# Patient Record
Sex: Male | Born: 1955 | Race: Black or African American | Hispanic: No | Smoking: Never smoker
Health system: Southern US, Community
[De-identification: ages and names within clinical notes are randomized; demographics above are authoritative.]

## PROBLEM LIST (undated history)

## (undated) ENCOUNTER — Emergency Department (HOSPITAL_BASED_OUTPATIENT_CLINIC_OR_DEPARTMENT_OTHER): Admission: EM | Payer: Managed Care, Other (non HMO) | Source: Home / Self Care

## (undated) DIAGNOSIS — J329 Chronic sinusitis, unspecified: Secondary | ICD-10-CM

## (undated) DIAGNOSIS — Z978 Presence of other specified devices: Secondary | ICD-10-CM

## (undated) DIAGNOSIS — I10 Essential (primary) hypertension: Secondary | ICD-10-CM

## (undated) DIAGNOSIS — N200 Calculus of kidney: Secondary | ICD-10-CM

---

## 1999-09-25 ENCOUNTER — Emergency Department (HOSPITAL_COMMUNITY): Admission: EM | Admit: 1999-09-25 | Discharge: 1999-09-25 | Payer: Self-pay | Admitting: Emergency Medicine

## 2000-06-20 ENCOUNTER — Emergency Department (HOSPITAL_COMMUNITY): Admission: EM | Admit: 2000-06-20 | Discharge: 2000-06-20 | Payer: Self-pay | Admitting: Emergency Medicine

## 2000-09-10 ENCOUNTER — Emergency Department (HOSPITAL_COMMUNITY): Admission: EM | Admit: 2000-09-10 | Discharge: 2000-09-10 | Payer: Self-pay | Admitting: Emergency Medicine

## 2001-05-15 ENCOUNTER — Emergency Department (HOSPITAL_COMMUNITY): Admission: EM | Admit: 2001-05-15 | Discharge: 2001-05-15 | Payer: Self-pay | Admitting: Emergency Medicine

## 2001-08-08 ENCOUNTER — Emergency Department (HOSPITAL_COMMUNITY): Admission: EM | Admit: 2001-08-08 | Discharge: 2001-08-08 | Payer: Self-pay | Admitting: Emergency Medicine

## 2002-01-17 ENCOUNTER — Emergency Department (HOSPITAL_COMMUNITY): Admission: EM | Admit: 2002-01-17 | Discharge: 2002-01-18 | Payer: Self-pay | Admitting: Emergency Medicine

## 2002-02-23 ENCOUNTER — Emergency Department (HOSPITAL_COMMUNITY): Admission: EM | Admit: 2002-02-23 | Discharge: 2002-02-23 | Payer: Self-pay | Admitting: *Deleted

## 2003-04-26 ENCOUNTER — Emergency Department (HOSPITAL_COMMUNITY): Admission: EM | Admit: 2003-04-26 | Discharge: 2003-04-26 | Payer: Self-pay | Admitting: Emergency Medicine

## 2004-05-14 ENCOUNTER — Emergency Department (HOSPITAL_COMMUNITY): Admission: EM | Admit: 2004-05-14 | Discharge: 2004-05-14 | Payer: Self-pay | Admitting: Emergency Medicine

## 2004-05-28 ENCOUNTER — Emergency Department (HOSPITAL_COMMUNITY): Admission: EM | Admit: 2004-05-28 | Discharge: 2004-05-28 | Payer: Self-pay | Admitting: Emergency Medicine

## 2006-07-25 ENCOUNTER — Emergency Department (HOSPITAL_COMMUNITY): Admission: EM | Admit: 2006-07-25 | Discharge: 2006-07-25 | Payer: Self-pay | Admitting: Emergency Medicine

## 2007-03-31 ENCOUNTER — Emergency Department (HOSPITAL_COMMUNITY): Admission: EM | Admit: 2007-03-31 | Discharge: 2007-03-31 | Payer: Self-pay | Admitting: Emergency Medicine

## 2008-07-31 ENCOUNTER — Emergency Department: Payer: Self-pay | Admitting: Emergency Medicine

## 2009-03-03 ENCOUNTER — Encounter: Payer: Self-pay | Admitting: Emergency Medicine

## 2009-03-03 ENCOUNTER — Ambulatory Visit: Payer: Self-pay | Admitting: Diagnostic Radiology

## 2009-03-03 ENCOUNTER — Observation Stay (HOSPITAL_COMMUNITY): Admission: EM | Admit: 2009-03-03 | Discharge: 2009-03-03 | Payer: Self-pay | Admitting: Emergency Medicine

## 2009-06-19 ENCOUNTER — Other Ambulatory Visit: Payer: Self-pay | Admitting: Emergency Medicine

## 2009-06-20 ENCOUNTER — Observation Stay (HOSPITAL_COMMUNITY): Admission: EM | Admit: 2009-06-20 | Discharge: 2009-06-21 | Payer: Self-pay | Admitting: Internal Medicine

## 2009-06-20 ENCOUNTER — Ambulatory Visit: Payer: Self-pay | Admitting: Cardiology

## 2009-11-22 ENCOUNTER — Emergency Department (HOSPITAL_BASED_OUTPATIENT_CLINIC_OR_DEPARTMENT_OTHER): Admission: EM | Admit: 2009-11-22 | Discharge: 2009-11-22 | Payer: Self-pay | Admitting: Emergency Medicine

## 2010-10-26 LAB — CBC
MCHC: 32.2 g/dL (ref 30.0–36.0)
MCV: 90.7 fL (ref 78.0–100.0)
Platelets: 273 10*3/uL (ref 150–400)
RDW: 12.8 % (ref 11.5–15.5)
WBC: 8 10*3/uL (ref 4.0–10.5)

## 2010-10-26 LAB — BASIC METABOLIC PANEL
BUN: 12 mg/dL (ref 6–23)
CO2: 28 mEq/L (ref 19–32)
Chloride: 100 mEq/L (ref 96–112)
GFR calc non Af Amer: >60 mL/min (ref >60)
Glucose, Bld: 112 mg/dL — ABNORMAL HIGH (ref 70–99)
Potassium: 3.9 mEq/L (ref 3.5–5.1)
Sodium: 143 mEq/L (ref 135–145)

## 2010-10-26 LAB — CARDIAC PANEL(CRET KIN+CKTOT+MB+TROPI)
CK, MB: 0.8 ng/mL (ref 0.3–4.0)
CK, MB: 1 ng/mL (ref 0.3–4.0)
Relative Index: 0.6 (ref 0.0–2.5)
Relative Index: 0.7 (ref 0.0–2.5)
Total CK: 122 U/L (ref 7–232)

## 2010-10-26 LAB — LIPID PANEL
Cholesterol: 216 mg/dL — ABNORMAL HIGH (ref 0–200)
HDL: 62 mg/dL (ref >39)
Total CHOL/HDL Ratio: 3.5 RATIO
Triglycerides: 140 mg/dL (ref <150)

## 2010-10-26 LAB — POCT CARDIAC MARKERS
CKMB, poc: 1 ng/mL (ref 1.0–8.0)
Myoglobin, poc: 68.5 ng/mL (ref 12–200)
Troponin i, poc: <0.05 ng/mL (ref 0.00–0.09)

## 2010-10-26 LAB — DIFFERENTIAL
Basophils Absolute: 0.4 10*3/uL — ABNORMAL HIGH (ref 0.0–0.1)
Lymphocytes Relative: 32 % (ref 12–46)
Lymphs Abs: 2.5 10*3/uL (ref 0.7–4.0)

## 2010-10-30 LAB — POCT CARDIAC MARKERS
CKMB, poc: <1 ng/mL — ABNORMAL LOW (ref 1.0–8.0)
Myoglobin, poc: 67.3 ng/mL (ref 12–200)
Troponin i, poc: <0.05 ng/mL (ref 0.00–0.09)

## 2010-10-30 LAB — URINALYSIS, ROUTINE W REFLEX MICROSCOPIC
Bilirubin Urine: NEGATIVE
Glucose, UA: NEGATIVE mg/dL
Hgb urine dipstick: NEGATIVE
Ketones, ur: NEGATIVE mg/dL
Nitrite: NEGATIVE
Specific Gravity, Urine: 1.019 (ref 1.005–1.030)
pH: 5.5 (ref 5.0–8.0)

## 2010-10-30 LAB — LIPID PANEL: HDL: 49 mg/dL (ref >39)

## 2010-10-30 LAB — DIFFERENTIAL
Basophils Absolute: 0.1 10*3/uL (ref 0.0–0.1)
Basophils Relative: 1 % (ref 0–1)
Eosinophils Absolute: 0.2 10*3/uL (ref 0.0–0.7)
Eosinophils Relative: 3 % (ref 0–5)
Lymphocytes Relative: 42 % (ref 12–46)
Monocytes Absolute: 0.6 10*3/uL (ref 0.1–1.0)
Neutro Abs: 3.3 10*3/uL (ref 1.7–7.7)
Neutrophils Relative %: 45 % (ref 43–77)

## 2010-10-30 LAB — BASIC METABOLIC PANEL
BUN: 16 mg/dL (ref 6–23)
Calcium: 9.9 mg/dL (ref 8.4–10.5)
Glucose, Bld: 113 mg/dL — ABNORMAL HIGH (ref 70–99)
Sodium: 138 mEq/L (ref 135–145)

## 2010-10-30 LAB — CBC: RDW: 13.4 % (ref 11.5–15.5)

## 2010-12-06 NOTE — H&P (Signed)
NAMEALLANTE, Caleb Hines NO.:  [id number]   MEDICAL RECORD NO.:  [id number]          PATIENT TYPE:  INP   LOCATION:  5511                         FACILITY:  [facility]   PHYSICIAN:  Massie Maroon, MD        DATE OF BIRTH:  [birth year]   DATE OF ADMISSION:  03/03/2009  DATE OF DISCHARGE:                              HISTORY & PHYSICAL   CHIEF COMPLAINT:  Chest pain.   HISTORY OF PRESENT ILLNESS:  A 55 year old male with a history of  hypertension, complains of left-sided pain which is described as a  little bit of pressure that radiates to the shoulder.  He also noted  some slight heartburn.  The patient denied any shortness of breath.  He  did note a little bit of palpitations after he used his Flonase.  The  chest pain occurred yesterday afternoon about 3 to 4 p.m.  The patient  cannot recall exactly what he was doing when it started.  He cannot  recall anything that made it better or worse.  The patient did receive  nitroglycerin paste in the ED, which did not cause relief of his pain.  The patient denies any fever, chills, cough, wheezing, bright red blood  per rectum, black stool.  The patient was transferred from [facility] ED to [facility] for further evaluation of chest  pain.  The patient notes that he has had a stress test which sounds like  a nuclear stress test in the past by his cardiologist, Dr. Viann Fish, which was per patient negative for ischemia.  The patient's  initial troponin-I in the ED was less than 0.05.  His first set of  cardiac markers was done on August 11 at 0048 hours.  Second set of  cardiac markers was done on August 11 at 0209 and was less than 0.05.  The patient will be admitted for workup of chest pain.  He does note  that his wife passed recently and he has been under stress due to that.   PAST MEDICAL HISTORY:  1. Hypertension.  2. Nephrolithiasis.   PAST SURGICAL HISTORY:  None.   SOCIAL HISTORY:  The  patient never smoked.  He does not drink.  He  denies any drug use.  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  His wife  passed away on 2009/01/27.  He has 1 boy and 1 girl.   FAMILY HISTORY:  Mother is alive at age 75 and had a stent placed at age  65.  She was a nonsmoker.  His father died of lung cancer at age 85 and  was a smoker.  He had 1 brother out of 11 siblings who had a massive MI  at age 30, and he was a heavy smoker as well as a drinker.   ALLERGIES:  ASPIRIN causes eye irritation after 3-4 days.   MEDICATIONS:  Micardis 80/25 mg p.o. daily   REVIEW OF SYSTEMS:  Negative for all 10 organ systems except for  pertinent positives as stated above.   PHYSICAL EXAMINATION:  Temperature 97.8, pulse 92, respiratory rate 18,  blood pressure 181/88, pulse oximetry 100% on room air.  HEENT: Anicteric, EOMI, no nystagmus, pupils 1.5 mm, symmetric, direct,  consensual, near reflexes intact.  Mucous membranes moist.  NECK:  No JVD, no bruit, no thyromegaly, no adenopathy.  HEART:  Regular rate and rhythm, S1, S2.  LUNGS:  Clear to auscultation bilaterally.  ABDOMEN:  Soft, nontender, nondistended.  Positive bowel sounds.  EXTREMITIES:  No cyanosis, clubbing or edema, DP pulses 2+ bilaterally.  SKIN:  No rashes.  LYMPH NODES:  No adenopathy.  NEUROLOGIC EXAM:  Nonfocal, cranial nerves II-XII intact, reflexes 2+,  symmetric, diffuse with downgoing toes bilaterally, motor strength 5/5  in all 4 extremities, pinprick intact.     Chest x-ray March 03, 2009:  No acute process.   Cardiac markers:  Troponin-I less than 0.05.  WBC 7.1, hemoglobin 14.8,  platelet count 302.   Sodium 138, potassium 4.5, chloride 99, bicarb 30, BUN 16, creatinine  1.1, glucose 113.   EKG:  Normal sinus rhythm at 93, normal PR, normal QTc, normal axis, ST  elevation in V1, V2, V3, which appears to be more J-point elevation and  was apparent on prior EKG, and T-wave inversion in V4, V5, V6, which was  also  present on prior EKG.   ASSESSMENT/PLAN:  1. Chest pain:  The patient will be placed on telemetry.  We will      check troponin-I q.8 h. x3 sets.  The patient will be made n.p.o.      for now in case Dr. Donnie Aho would like to run a stress test on him      today.  We will continue with aspirin, Lipitor, carvedilol as well      as nitroglycerin paste.  We will consult Dr. Donnie Aho this morning      for further evaluation of his chest pain.  2. Hypertension, uncontrolled.  The patient will be continued on      Micardis 80/25 mg p.o. daily and we will add carvedilol 3.125 mg      p.o. b.i.d.  3. Deep vein thrombosis prophylaxis.  Lovenox 40 mg subcu daily this.      Massie Maroon, MD  Electronically Signed     JYK/MEDQ  D:  03/03/2009  T:  03/03/2009  Job:  [id number]   cc:   Maryelizabeth Rowan, M.D.  Georga Hacking, M.D.

## 2010-12-06 NOTE — Discharge Summary (Signed)
NAMEKIEV, LABROSSE NO.:  [id number]   MEDICAL RECORD NO.:  [id number]          PATIENT TYPE:  INP   LOCATION:  5511                         FACILITY:  [facility]   PHYSICIAN:  Isidor Holts, M.D.  DATE OF BIRTH:  1955/12/21   DATE OF ADMISSION:  03/03/2009  DATE OF DISCHARGE:  03/03/2009                               DISCHARGE SUMMARY   PMD:  Unassigned.   PRIMARY CARDIOLOGIST:  Dr. Viann Fish.   DISCHARGE DIAGNOSES:  1. Atypical chest pain.  2. Hypertension.  3. History of asymmetric septal hypertrophy.  4. Dyslipidemia.  5. Allergic rhinitis.  6. History of urolithiasis.   DISCHARGE MEDICATIONS:  1. Micardis (80/25) one p.o. daily.  2. Flonase nasal spray 2 sprays each nostril p.r.n. daily.  3. Aspirin 81 mg p.o. daily.  4. Prilosec 20 mg p.o. daily.  5. Cholesterol-lowering pill, in preadmission dosage.   PROCEDURES:  Chest x-ray done March 03, 2009.  This showed no acute  cardiopulmonary process.   CONSULTATIONS:  None.   Telephone discussion held with Dr. Viann Fish, cardiologist, on  March 03, 2009.   ADMISSION HISTORY:  As in H and P notes of March 03, 2009, dictated by  Dr. Massie Maroon.  However, in brief, this is a 55 year old male, with  known history of hypertension, dyslipidemia, asymmetric septal  hypertrophy, status post negative stress October 2008 by Dr. Viann Fish, allergic rhinitis, dyslipidemia, presenting with chest pain  which occurred about 3 to 4 p.m. on March 02, 2009.  Pain was described  as left sided, associated with heartburn.  Patient initially presented  to [facility], where he was evaluated, a 12-lead EKG was done,  and then he was transferred to [facility] for admission.   CLINICAL COURSE:  1. Chest pain.  This had somewhat atypical features, was not this      relieved by nitro paste applied en route to [facility],      however, was relieved by GI cocktail administered  by ED M.D,      according to patient.  A 12-lead EKG showed no acute ischemic      changes.  Cardiac enzymes were cycled, remained un-elevated.  As of      March 03, 2009, a.m., patient was totally asymptomatic, and keen      to be discharged.  I did contact Dr. Viann Fish, his primary      cardiologist, via telephone on that date, and Dr. Donnie Aho has kindly      agreed to set up a stress test on March 04, 2009.  Patient,      however, has been placed on low-dose Aspirin and proton pump      inhibitor.   1. Hypertension.  Patient remained normotensive throughout the course      of his hospitalization.   1. Dyslipidemia.  Patient's lipid profile showed the following      findings:  Total cholesterol 201, triglycerides 73, HDL 49, LDL      139.  Patient states that he is already on a cholesterol-lowering  pill, pre-admission, although he does not remember the name.  I      have urged him to continue to utilize this medication, until      reevaluated by his primary M.D.   1. Nasal allergies.  Patient continues on Flonase nasal spray.  There      were no problems referable to this.   1. History of urolithiasis.  Patient was asymptomatic from this      viewpoint.   DISPOSITION:  Patient was on March 03, 2009, asymptomatic, very keen to  go home.  Acute coronary syndrome was ruled out.  He was therefore  discharged accordingly.   DIET:  Heart healthy.   ACTIVITY:  As tolerated.   FOLLOWUP INSTRUCTIONS:  Patient is to follow up with Dr. Viann Fish  on March 03, 2009, after leaving hospital to set up a stress test,  which per Dr. Donnie Aho has been scheduled for March 04, 2009.  Further  management will depend on stress test findings and this will be deferred  to Dr. Viann Fish.  All of this has been communicated to patient and  he verbalized understanding.      Isidor Holts, M.D.  Electronically Signed     CO/MEDQ  D:  03/03/2009  T:  03/03/2009  Job:   [id number]   cc:   Georga Hacking, M.D.

## 2011-02-14 ENCOUNTER — Encounter: Payer: Self-pay | Admitting: *Deleted

## 2011-02-14 ENCOUNTER — Emergency Department (HOSPITAL_BASED_OUTPATIENT_CLINIC_OR_DEPARTMENT_OTHER)
Admission: EM | Admit: 2011-02-14 | Discharge: 2011-02-15 | Disposition: A | Discharge status: Home / Self Care | Payer: Self-pay | Attending: Emergency Medicine | Admitting: Emergency Medicine

## 2011-02-14 DIAGNOSIS — I10 Essential (primary) hypertension: Secondary | ICD-10-CM | POA: Insufficient documentation

## 2011-02-14 DIAGNOSIS — J329 Chronic sinusitis, unspecified: Secondary | ICD-10-CM | POA: Insufficient documentation

## 2011-02-14 HISTORY — DX: Essential (primary) hypertension: I10

## 2011-02-14 NOTE — ED Notes (Signed)
C/o sinus pressure in head and face, Also c/o ear pain and productive cough with yellow sputum

## 2011-02-15 ENCOUNTER — Emergency Department (INDEPENDENT_AMBULATORY_CARE_PROVIDER_SITE_OTHER): Payer: Self-pay

## 2011-02-15 DIAGNOSIS — R05 Cough: Secondary | ICD-10-CM

## 2011-02-15 DIAGNOSIS — R0602 Shortness of breath: Secondary | ICD-10-CM

## 2011-02-15 DIAGNOSIS — R059 Cough, unspecified: Secondary | ICD-10-CM

## 2011-02-15 IMAGING — CR DG CHEST 2V
2 series · 2 of 2 positions shown · non-contrast
Comparison: [DATE]

CLINICAL DATA: Cough, shortness of breath

CHEST - 2 VIEW

[w chest pa]
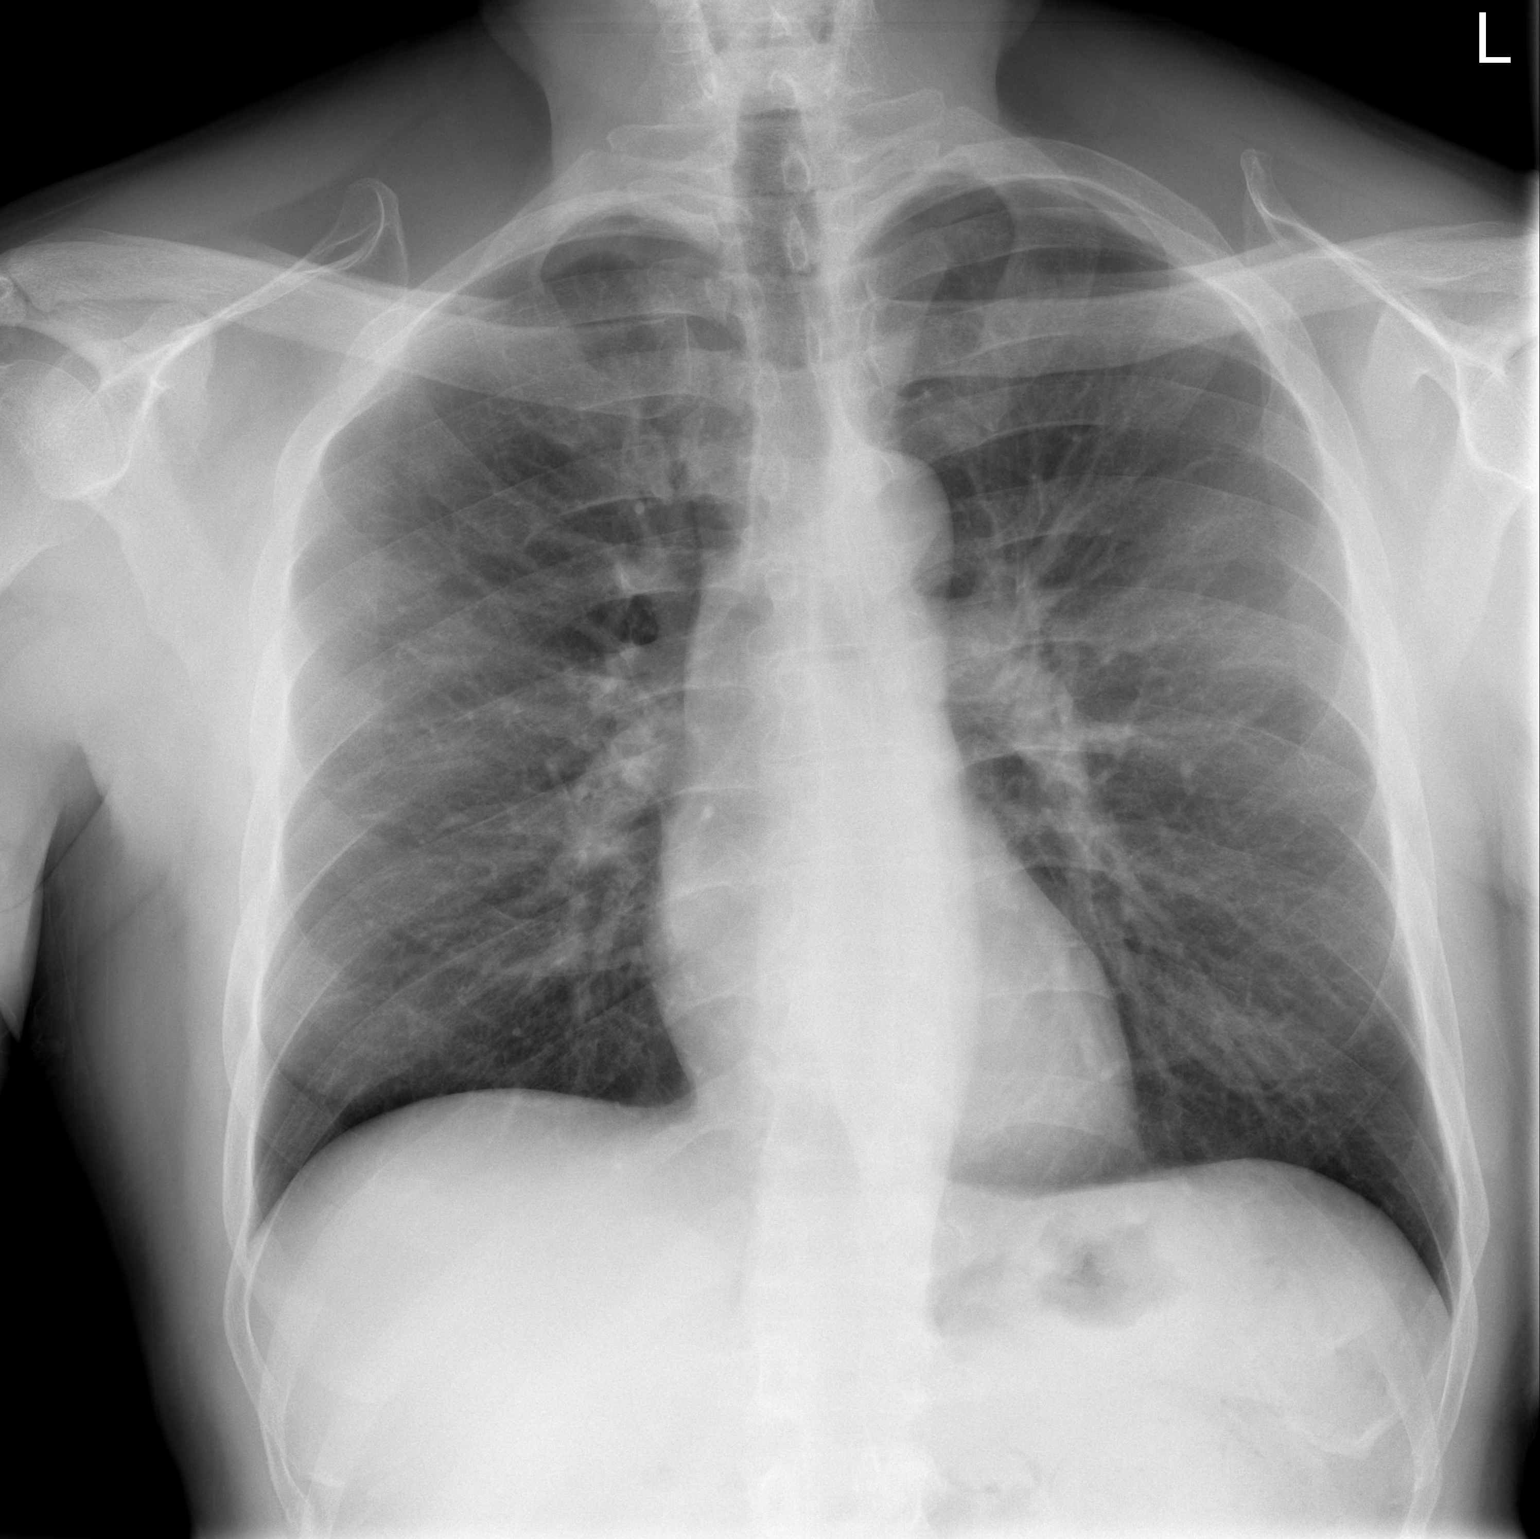

[w chest lat]
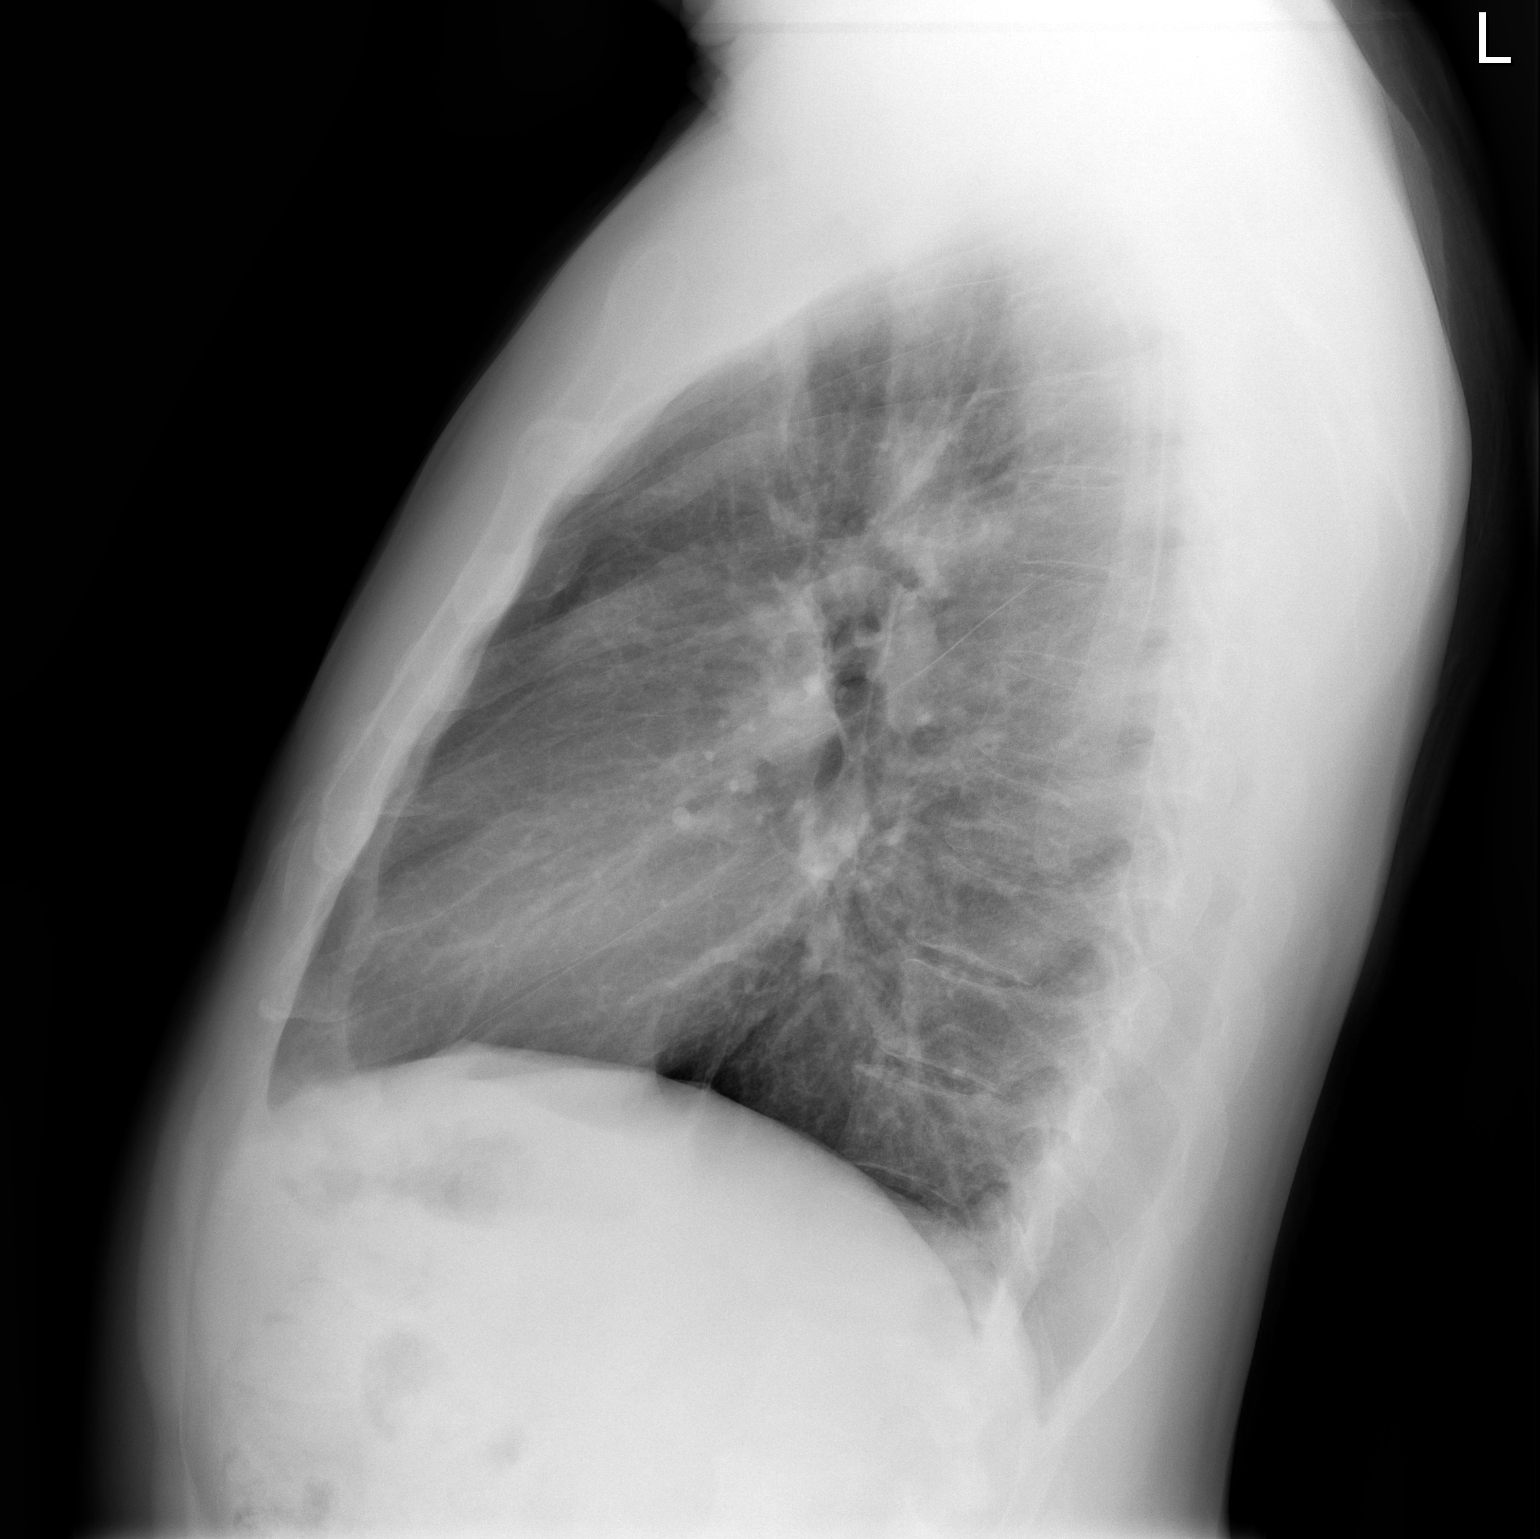

[2 of 2 positions shown; findings below may reference images not displayed]

FINDINGS: Lungs clear.  Heart size and pulmonary vascularity
normal.  No effusion.  Visualized bones unremarkable.
IMPRESSION: No acute disease

## 2011-02-15 MED ORDER — IBUPROFEN 800 MG PO TABS: 800.0000 mg | ORAL_TABLET | Freq: Three times a day (TID) | ORAL | Status: AC

## 2011-02-15 MED ORDER — AMOXICILLIN 500 MG PO CAPS: 500.0000 mg | ORAL_CAPSULE | Freq: Three times a day (TID) | ORAL | Status: AC

## 2011-02-15 NOTE — ED Notes (Signed)
Pt states that he does not need an xray.

## 2011-02-15 NOTE — ED Provider Notes (Addendum)
History     Chief Complaint  Patient presents with  . Sinusitis   Patient is a 55 y.o. male presenting with sinusitis. The history is provided by the patient.  Sinusitis  This is a new problem. The current episode started more than 1 week ago. The problem has not changed since onset.There has been no fever. The pain is moderate. The pain has been constant since onset. Associated symptoms include congestion, hoarse voice, sinus pressure, sore throat and cough. Pertinent negatives include no chills, no sweats, no ear pain, no swollen glands and no shortness of breath. He has tried nothing for the symptoms. The treatment provided no relief.   States he gets this about once a year and requires amoxicillin. No rash, no CP, feels some PND, has tried zyrtec without relief Past Medical History  Diagnosis Date  . Hypertension     History reviewed. No pertinent past surgical history.  History reviewed. No pertinent family history.  History  Substance Use Topics  . Smoking status: Never Smoker   . Smokeless tobacco: Not on file  . Alcohol Use: No      Review of Systems  Constitutional: Negative for fever and chills.  HENT: Positive for congestion, sore throat, hoarse voice and sinus pressure. Negative for ear pain, neck pain and neck stiffness.   Eyes: Negative for pain.  Respiratory: Positive for cough. Negative for shortness of breath.   Cardiovascular: Negative for chest pain.  Gastrointestinal: Negative for abdominal pain.  Genitourinary: Negative for dysuria.  Musculoskeletal: Negative for back pain and gait problem.  Skin: Negative for rash.  Neurological: Negative for headaches.  All other systems reviewed and are negative.    Physical Exam  BP 167/93  Pulse 73  Temp(Src) 98.1 F (36.7 C) (Oral)  Resp 16  SpO2 100%  Physical Exam  Constitutional: He is oriented to person, place, and time. He appears well-developed and well-nourished.  HENT:  Head: Normocephalic and  atraumatic.       Sinuses TTP of bilateral maxilla, nares clear, oral pharynx clear, uvula midline  Eyes: Conjunctivae and EOM are normal. Pupils are equal, round, and reactive to light.  Neck: Trachea normal. Neck supple. No thyromegaly present.  Cardiovascular: Normal rate, regular rhythm, S1 normal, S2 normal and normal pulses.     No systolic murmur is present   No diastolic murmur is present  Pulses:      Radial pulses are 2+ on the right side, and 2+ on the left side.  Pulmonary/Chest: Effort normal and breath sounds normal. He has no wheezes. He has no rhonchi. He has no rales. He exhibits no tenderness.  Abdominal: Soft. Normal appearance and bowel sounds are normal. There is no tenderness. There is no CVA tenderness and negative Murphy's sign.  Musculoskeletal:       BLE:s Calves nontender, no cords or erythema, negative Homans sign  Neurological: He is alert and oriented to person, place, and time. He has normal strength. No cranial nerve deficit or sensory deficit. GCS eye subscore is 4. GCS verbal subscore is 5. GCS motor subscore is 6.  Skin: Skin is warm and dry. No rash noted. He is not diaphoretic.  Psychiatric: His speech is normal.       Cooperative and appropriate    ED Course  Procedures  MDM Treated for sinusitis, states understanding Tx plan and f/u recs. CXR obtained and reviewed.       Sunnie Nielsen, MD 02/15/11 [id number]  Sunnie Nielsen, MD 02/15/11  0201 

## 2011-11-12 ENCOUNTER — Emergency Department (HOSPITAL_BASED_OUTPATIENT_CLINIC_OR_DEPARTMENT_OTHER)
Admission: EM | Admit: 2011-11-12 | Discharge: 2011-11-12 | Disposition: A | Discharge status: Home / Self Care | Payer: Managed Care, Other (non HMO) | Attending: Emergency Medicine | Admitting: Emergency Medicine

## 2011-11-12 ENCOUNTER — Encounter (HOSPITAL_BASED_OUTPATIENT_CLINIC_OR_DEPARTMENT_OTHER): Payer: Self-pay | Admitting: *Deleted

## 2011-11-12 DIAGNOSIS — E871 Hypo-osmolality and hyponatremia: Secondary | ICD-10-CM

## 2011-11-12 DIAGNOSIS — Z87442 Personal history of urinary calculi: Secondary | ICD-10-CM | POA: Insufficient documentation

## 2011-11-12 DIAGNOSIS — R339 Retention of urine, unspecified: Secondary | ICD-10-CM

## 2011-11-12 DIAGNOSIS — E860 Dehydration: Secondary | ICD-10-CM | POA: Insufficient documentation

## 2011-11-12 DIAGNOSIS — Z79899 Other long term (current) drug therapy: Secondary | ICD-10-CM | POA: Insufficient documentation

## 2011-11-12 DIAGNOSIS — I10 Essential (primary) hypertension: Secondary | ICD-10-CM | POA: Insufficient documentation

## 2011-11-12 HISTORY — DX: Calculus of kidney: N20.0

## 2011-11-12 LAB — CBC
MCHC: 35.5 g/dL (ref 30.0–36.0)
MCV: 85.9 fL (ref 78.0–100.0)
RBC: 5.05 MIL/uL (ref 4.22–5.81)

## 2011-11-12 LAB — BASIC METABOLIC PANEL
Calcium: 9.6 mg/dL (ref 8.4–10.5)
GFR calc Af Amer: >90 mL/min (ref >90)
GFR calc non Af Amer: >90 mL/min (ref >90)
Glucose, Bld: 136 mg/dL — ABNORMAL HIGH (ref 70–99)

## 2011-11-12 LAB — URINALYSIS, ROUTINE W REFLEX MICROSCOPIC
Leukocytes, UA: NEGATIVE
Nitrite: NEGATIVE
Specific Gravity, Urine: 1.013 (ref 1.005–1.030)
pH: 6 (ref 5.0–8.0)

## 2011-11-12 LAB — URINE MICROSCOPIC-ADD ON

## 2011-11-12 NOTE — ED Provider Notes (Signed)
History   This chart was scribed for Ethelda Chick, MD scribed by Magnus Sinning. The patient was seen in room MH08/MH08 seen at 19:09   CSN: [id number]  Arrival date & time 11/12/11  1719   First MD Initiated Contact with Patient 11/12/11 1838      Chief Complaint  Patient presents with  . Abdominal Pain    (Consider location/radiation/quality/duration/timing/severity/associated sxs/prior treatment) HPI Caleb Hines is a 56 y.o. male who presents to the Emergency Department complaining of constant moderate inability to urinate, onset yesterday, with associated lower abd pain. Says yesterday he took two Aleve-D, for allergies, and noticed he had decreased urinary output ,difficultly urinating, dry mouth, and pain in lower abd. He says he was seen at [facility] yesterday and was informed that his prostate was enlarged and that he had a possible infection. Says he was prescribed an abx for the suspected infection. Says he is able to urinate, but that it is still in small amounts. Denies any other sxs, aggravating, or modifying factors.  Past Medical History  Diagnosis Date  . Hypertension   . Kidney stones     History reviewed. No pertinent past surgical history.  History reviewed. No pertinent family history.  History  Substance Use Topics  . Smoking status: Never Smoker   . Smokeless tobacco: Never Used  . Alcohol Use: No    Review of Systems 10 Systems reviewed and are negative for acute change except as noted in the HPI. Allergies  Review of patient's allergies indicates no known allergies.  Home Medications   Current Outpatient Rx  Name Route Sig Dispense Refill  . VITAMIN C PO Oral Take 2 tablets by mouth daily.    . ATENOLOL 50 MG PO TABS Oral Take 50 mg by mouth daily.      Marland Kitchen CIPROFLOXACIN HCL 250 MG PO TABS Oral Take 250 mg by mouth 2 (two) times daily. For 14 days starting 11/12/11    . HYDROCODONE-ACETAMINOPHEN 5-500 MG PO TABS Oral Take 1-2  tablets by mouth every 6 (six) hours as needed. For pain (max 8 per day)    . IBUPROFEN 800 MG PO TABS Oral Take 800 mg by mouth once as needed. For pain    . MAGNESIUM OXIDE PO Oral Take 1 tablet by mouth daily.    . ADULT MULTIVITAMIN W/MINERALS CH Oral Take 1 tablet by mouth daily.    Marland Kitchen FISH OIL PO Oral Take 1 capsule by mouth daily.    Marland Kitchen PSEUDOEPHEDRINE-NAPROXEN NA ER 120-220 MG PO TB12 Oral Take 2 tablets by mouth once.      BP 171/96  Pulse 64  Temp(Src) 98.1 F (36.7 C) (Oral)  Resp 18  SpO2 100%  Physical Exam  Nursing note and vitals reviewed. Constitutional: He is oriented to person, place, and time. He appears well-developed and well-nourished. No distress.  HENT:  Head: Normocephalic and atraumatic.  Eyes: EOM are normal. Pupils are equal, round, and reactive to light.  Neck: Neck supple. No tracheal deviation present.  Cardiovascular: Normal rate.   Pulmonary/Chest: Effort normal. No respiratory distress.  Abdominal: Soft. He exhibits no distension. There is tenderness.       Mild tenderness to the suprapubic region  Musculoskeletal: Normal range of motion. He exhibits no edema.       No CVA tenderness  Neurological: He is alert and oriented to person, place, and time. No sensory deficit.  Skin: Skin is warm and dry.  Psychiatric: He has  a normal mood and affect. His behavior is normal.    ED Course  Procedures (including critical care time) DIAGNOSTIC STUDIES: Oxygen Saturation is 100% on room air, normal by my interpretation.    COORDINATION OF CARE:  Labs Reviewed  BASIC METABOLIC PANEL - Abnormal; Notable for the following:    Sodium 126 (*)    Chloride 89 (*)    Glucose, Bld 136 (*)    All other components within normal limits  URINALYSIS, ROUTINE W REFLEX MICROSCOPIC - Abnormal; Notable for the following:    Hgb urine dipstick SMALL (*)    All other components within normal limits  CBC  URINE CULTURE  URINE MICROSCOPIC-ADD ON    1. Urinary  retention   2. Hyponatremia       MDM  Patient presents with lower abdominal pain and a sensation of needing to urinate and only being able to urinate small amounts. He was seen earlier today at [facility] emergency department. He states he had a CT scan and was given pain medication and an antibiotic for his urine. He comes to the ED here because he continues to have difficulty urinating and lower abdominal pain. A Foley catheter was placed for urinary retention and patient had approximately 750 cc of urine mouth. His abdominal pain is completely resolved and he feels much improved after the catheter was placed. His renal function is normal. He does have mild hyponatremia. Patient was advised to followup with urology and was given strict return precautions. He is agreeable with this plan.  I personally performed the services described in this documentation, which was scribed in my presence. The recorded information has been reviewed and considered.         Ethelda Chick, MD 11/16/11 [id number]

## 2011-11-12 NOTE — Discharge Instructions (Signed)
Return to the ED with any concerns including decreased urine and to Foley catheter, abdominal pain, fevers or chills, vomiting, decreased level of alertness or lethargy, or any other alarming symptoms.  It is important that you arrange for followup with urology within the next 2-3 days and they will determine when it is advisable to remove the catheter.

## 2011-11-12 NOTE — ED Notes (Signed)
Pt reports low abd pain since wed- today reports he felt like he couldn't get his bladder emptied- went to [facility] ED today and had UA and CT scan- states he is not feeling any better

## 2011-11-12 NOTE — ED Notes (Signed)
Pt verbalizes understanding of use of leg bag-

## 2011-11-14 LAB — URINE CULTURE

## 2011-11-15 ENCOUNTER — Encounter (HOSPITAL_BASED_OUTPATIENT_CLINIC_OR_DEPARTMENT_OTHER): Payer: Self-pay | Admitting: Emergency Medicine

## 2011-11-15 ENCOUNTER — Emergency Department (HOSPITAL_BASED_OUTPATIENT_CLINIC_OR_DEPARTMENT_OTHER)
Admission: EM | Admit: 2011-11-15 | Discharge: 2011-11-15 | Disposition: A | Discharge status: Home / Self Care | Payer: Managed Care, Other (non HMO) | Attending: Emergency Medicine | Admitting: Emergency Medicine

## 2011-11-15 DIAGNOSIS — Z79899 Other long term (current) drug therapy: Secondary | ICD-10-CM | POA: Insufficient documentation

## 2011-11-15 DIAGNOSIS — N400 Enlarged prostate without lower urinary tract symptoms: Secondary | ICD-10-CM

## 2011-11-15 DIAGNOSIS — I10 Essential (primary) hypertension: Secondary | ICD-10-CM | POA: Insufficient documentation

## 2011-11-15 NOTE — ED Notes (Signed)
Pt able to void

## 2011-11-15 NOTE — Discharge Instructions (Signed)
Benign Prostatic Hyperplasia You have an enlarged prostate. This is common in elderly males. It is called BPH. This stands for benign prostate hyperplasia. The prostate gland is located in base of the bladder. When it grows, the prostate blocks the urethra. This is the tube which drains urine from the bladder.  SYMPTOMS  Weak urine stream.   Dribbling.   Feeling like the bladder has not emptied completely.   Difficulty starting urination.   Getting up frequently at night to urinate.   Urinating more frequently during the day.  Complete urinary blockage or severe pain with urination requires immediate attention. DIAGNOSIS   Your caregiver often has a good idea what is wrong by taking a history and doing a physical exam.   Special x-rays may be done.  TREATMENT   For mild problems, no treatment may be necessary.   If the problems are moderate, medications may provide relief. Some of these work by making the prostate gland smaller. The herb saw palmetto is commonly used.   If complete blockage occurs, a Foley catheter is usually left in place for a few days.   Surgery is often needed for more severe problems. TURP is the prostate surgery for BPH which is done through the urethra. TURP stands for transurethral resection of the prostate. It involves cutting away chips from the prostate. It is done by removing chips so that they can come out through the penis.   Techniques using heat, microwave and laser to remove the prostate blockage are also being used.  HOME CARE INSTRUCTIONS   Give yourself time when you urinate.   Stay away from alcohol.   Beverages containing caffeine such as coffee, tea and colas can make the problems worse.   Decongestants, antihistamines, and some prescription medicines can also make the problem worse.   Follow up with your caregiver for further treatment as recommended.  SEEK IMMEDIATE MEDICAL CARE IF:   You develop increased pain with urination or  are unable to pass your water.   You develop severe abdominal pain, vomiting, a high fever, or fainting.   You develop back pain or blood in your urine.  MAKE SURE YOU:   Understand these instructions.   Will watch your condition.   Will get help right away if you are not doing well or get worse.  Document Released: 07/10/2005 Document Revised: 06/29/2011 Document Reviewed: 03/15/2007 [employer] Patient Information 2012 [employer]. 

## 2011-11-15 NOTE — ED Provider Notes (Signed)
History     CSN: [id number]  Arrival date & time 11/15/11  1516   First MD Initiated Contact with Patient 11/15/11 1600      Chief Complaint  Patient presents with  . Follow-up    Needs foley catheter removed.    (Consider location/radiation/quality/duration/timing/severity/associated sxs/prior treatment) HPI Comments: Pt is a 56 year old man who had been seen for urinary retention 3 days ago.  She was referred to [facility], but could not get in in a timely way, and so came to [facility] ED for catheter removal.   Patient is a 56 y.o. male presenting with male genitourinary complaint. The history is provided by the patient. No language interpreter was used.  Male GU Problem Primary symptoms comment: Here for foley catheter removal.  This is a new problem. Episode onset: Today.  He was treated for urinary retention 3 days ago.   The problem has not changed since onset.Context: He has no complaints, has done well with the catheter. There has been no fever. He has tried nothing for the symptoms. Associated medical issues comments: Benign prostatic hypertrophy..    Past Medical History  Diagnosis Date  . Hypertension   . Kidney stones     History reviewed. No pertinent past surgical history.  No family history on file.  History  Substance Use Topics  . Smoking status: Never Smoker   . Smokeless tobacco: Never Used  . Alcohol Use: No      Review of Systems  All other systems reviewed and are negative.    Allergies  Review of patient's allergies indicates no known allergies.  Home Medications   Current Outpatient Rx  Name Route Sig Dispense Refill  . ATENOLOL 50 MG PO TABS Oral Take 50 mg by mouth daily.      Marland Kitchen CIPROFLOXACIN HCL 250 MG PO TABS Oral Take 250 mg by mouth 2 (two) times daily. For 14 days starting 11/12/11    . HYDROCODONE-ACETAMINOPHEN 5-500 MG PO TABS Oral Take 1-2 tablets by mouth every 6 (six) hours as needed. For pain (max 8 per day)     . IBUPROFEN 800 MG PO TABS Oral Take 800 mg by mouth once as needed. For pain    . ADULT MULTIVITAMIN W/MINERALS CH Oral Take 1 tablet by mouth daily.    Marland Kitchen VITAMIN C PO Oral Take 2 tablets by mouth daily.    Marland Kitchen MAGNESIUM OXIDE PO Oral Take 1 tablet by mouth daily.    Marland Kitchen FISH OIL PO Oral Take 1 capsule by mouth daily.    Marland Kitchen PSEUDOEPHEDRINE-NAPROXEN NA ER 120-220 MG PO TB12 Oral Take 2 tablets by mouth once.      BP 165/83  Pulse 67  Temp(Src) 98.1 F (36.7 C) (Oral)  Resp 16  Ht 6\' 2"  (1.88 m)  Wt 215 lb (97.523 kg)  BMI 27.60 kg/m2  SpO2 100%  Physical Exam  Nursing note and vitals reviewed. Constitutional: He is oriented to person, place, and time. He appears well-developed and well-nourished. No distress.  HENT:  Head: Normocephalic and atraumatic.  Right Ear: External ear normal.  Left Ear: External ear normal.  Mouth/Throat: Oropharynx is clear and moist.  Eyes: Conjunctivae and EOM are normal. Pupils are equal, round, and reactive to light.  Neck: Normal range of motion. Neck supple.  Cardiovascular: Normal rate, regular rhythm and normal heart sounds.   Pulmonary/Chest: Effort normal and breath sounds normal.  Abdominal: Soft. Bowel sounds are normal.  Musculoskeletal: Normal  range of motion.  Neurological: He is alert and oriented to person, place, and time.       No sensory or motor deficit.  Skin: Skin is warm and dry.  Psychiatric: He has a normal mood and affect. His behavior is normal.    ED Course  Procedures (including critical care time)   Pt had foley catheter removed.  Given fluids, is waiting to void.    5:26 PM Pt urinated.  Released.  Advised followup at [facility] for BPH.  1. Benign prostatic hypertrophy           Carleene Cooper III, MD 11/15/11 [phone]

## 2011-11-15 NOTE — ED Notes (Signed)
Patient came in on Sunday and had an indwelling urinary catheter placed.  Cannot see urologist soon enough so he was told to come here and have it removed.

## 2012-08-11 ENCOUNTER — Encounter (HOSPITAL_BASED_OUTPATIENT_CLINIC_OR_DEPARTMENT_OTHER): Payer: Self-pay | Admitting: Emergency Medicine

## 2012-08-11 ENCOUNTER — Emergency Department (HOSPITAL_BASED_OUTPATIENT_CLINIC_OR_DEPARTMENT_OTHER)
Admission: EM | Admit: 2012-08-11 | Discharge: 2012-08-11 | Disposition: A | Discharge status: Home / Self Care | Payer: Self-pay | Attending: Emergency Medicine | Admitting: Emergency Medicine

## 2012-08-11 DIAGNOSIS — J3489 Other specified disorders of nose and nasal sinuses: Secondary | ICD-10-CM | POA: Insufficient documentation

## 2012-08-11 DIAGNOSIS — J329 Chronic sinusitis, unspecified: Secondary | ICD-10-CM | POA: Insufficient documentation

## 2012-08-11 DIAGNOSIS — Z79899 Other long term (current) drug therapy: Secondary | ICD-10-CM | POA: Insufficient documentation

## 2012-08-11 DIAGNOSIS — Z791 Long term (current) use of non-steroidal anti-inflammatories (NSAID): Secondary | ICD-10-CM | POA: Insufficient documentation

## 2012-08-11 DIAGNOSIS — I10 Essential (primary) hypertension: Secondary | ICD-10-CM | POA: Insufficient documentation

## 2012-08-11 DIAGNOSIS — Z87442 Personal history of urinary calculi: Secondary | ICD-10-CM | POA: Insufficient documentation

## 2012-08-11 HISTORY — DX: Chronic sinusitis, unspecified: J32.9

## 2012-08-11 MED ORDER — OXYMETAZOLINE HCL 0.05 % NA SOLN: 2.0000 | Freq: Two times a day (BID) | NASAL | Status: DC

## 2012-08-11 MED ORDER — AMOXICILLIN 500 MG PO CAPS: 500.0000 mg | ORAL_CAPSULE | Freq: Three times a day (TID) | ORAL | Status: DC

## 2012-08-11 NOTE — ED Notes (Signed)
Pt having sinus pressure, nasal congestion, yellow drainage, and fever for one month.

## 2012-08-11 NOTE — ED Provider Notes (Signed)
History     CSN: [id number]  Arrival date & time 08/11/12  1214   First MD Initiated Contact with Patient 08/11/12 1232      Chief Complaint  Patient presents with  . Facial Pain  . Nasal Congestion    (Consider location/radiation/quality/duration/timing/severity/associated sxs/prior treatment) HPI Pt with history of recurrent sinusitis responsive to amox presents with R maxillary sinus pressure x 1 month. No fever or chills. +nasal congestion and yellow purulent nasal drainage. No sore throat, neck pain.  Past Medical History  Diagnosis Date  . Hypertension   . Kidney stones   . Sinusitis     No past surgical history on file.  No family history on file.  History  Substance Use Topics  . Smoking status: Never Smoker   . Smokeless tobacco: Never Used  . Alcohol Use: No      Review of Systems  Constitutional: Negative for fever and chills.  HENT: Positive for congestion and sinus pressure. Negative for ear pain, sore throat, neck pain and neck stiffness.   Respiratory: Negative for cough and shortness of breath.   Skin: Negative for rash.  Neurological: Negative for headaches.    Allergies  Review of patient's allergies indicates no known allergies.  Home Medications   Current Outpatient Rx  Name  Route  Sig  Dispense  Refill  . AMOXICILLIN 500 MG PO CAPS   Oral   Take 1 capsule (500 mg total) by mouth 3 (three) times daily.   21 capsule   0   . VITAMIN C PO   Oral   Take 2 tablets by mouth daily.         . ATENOLOL 50 MG PO TABS   Oral   Take 50 mg by mouth daily.           . IBUPROFEN 800 MG PO TABS   Oral   Take 800 mg by mouth once as needed. For pain         . MAGNESIUM OXIDE PO   Oral   Take 1 tablet by mouth daily.         . ADULT MULTIVITAMIN W/MINERALS CH   Oral   Take 1 tablet by mouth daily.         Marland Kitchen FISH OIL PO   Oral   Take 1 capsule by mouth daily.         Marland Kitchen OXYMETAZOLINE HCL 0.05 % NA SOLN   Nasal   Place 2  sprays into the nose 2 (two) times daily.   30 mL   0     BP 172/94  Pulse 65  Temp 98.2 F (36.8 C) (Oral)  Resp 16  SpO2 99%  Physical Exam  Nursing note and vitals reviewed. Constitutional: He is oriented to person, place, and time. He appears well-developed and well-nourished. No distress.  HENT:  Head: Normocephalic and atraumatic.  Nose: Mucosal edema and septal deviation present. Right sinus exhibits maxillary sinus tenderness. Right sinus exhibits no frontal sinus tenderness. Left sinus exhibits no maxillary sinus tenderness and no frontal sinus tenderness.  Mouth/Throat: Oropharynx is clear and moist. No oropharyngeal exudate.  Eyes: Pupils are equal, round, and reactive to light.  Neck: Normal range of motion. Neck supple.  Pulmonary/Chest: Effort normal.  Abdominal: Soft.  Musculoskeletal: Normal range of motion.  Neurological: He is alert and oriented to person, place, and time.  Skin: Skin is warm and dry. He is not diaphoretic.  Psychiatric: He has a normal  mood and affect. His behavior is normal.    ED Course  Procedures (including critical care time)  Labs Reviewed - No data to display No results found.   1. Sinusitis       MDM  Will treat fro R max sinusitis         Loren Racer, MD 08/11/12 1323

## 2012-09-19 ENCOUNTER — Emergency Department (HOSPITAL_BASED_OUTPATIENT_CLINIC_OR_DEPARTMENT_OTHER)
Admission: EM | Admit: 2012-09-19 | Discharge: 2012-09-19 | Disposition: A | Discharge status: Home / Self Care | Payer: Self-pay | Attending: Emergency Medicine | Admitting: Emergency Medicine

## 2012-09-19 ENCOUNTER — Encounter (HOSPITAL_BASED_OUTPATIENT_CLINIC_OR_DEPARTMENT_OTHER): Payer: Self-pay | Admitting: *Deleted

## 2012-09-19 DIAGNOSIS — J3489 Other specified disorders of nose and nasal sinuses: Secondary | ICD-10-CM | POA: Insufficient documentation

## 2012-09-19 DIAGNOSIS — I10 Essential (primary) hypertension: Secondary | ICD-10-CM | POA: Insufficient documentation

## 2012-09-19 DIAGNOSIS — Z87442 Personal history of urinary calculi: Secondary | ICD-10-CM | POA: Insufficient documentation

## 2012-09-19 DIAGNOSIS — Z79899 Other long term (current) drug therapy: Secondary | ICD-10-CM | POA: Insufficient documentation

## 2012-09-19 DIAGNOSIS — J329 Chronic sinusitis, unspecified: Secondary | ICD-10-CM | POA: Insufficient documentation

## 2012-09-19 MED ORDER — SULFAMETHOXAZOLE-TRIMETHOPRIM 800-160 MG PO TABS: 1.0000 | ORAL_TABLET | Freq: Two times a day (BID) | ORAL | Status: DC

## 2012-09-19 NOTE — ED Notes (Signed)
Ear pain x 3 weeks. States he thinks he has sinusitis. Facial pain. No relief with Amoxicillin.

## 2012-09-19 NOTE — ED Provider Notes (Signed)
History     CSN: [id number]  Arrival date & time 09/19/12  1736   First MD Initiated Contact with Patient 09/19/12 1749      Chief Complaint  Patient presents with  . Otalgia    (Consider location/radiation/quality/duration/timing/severity/associated sxs/prior treatment) HPI Comments: Pt states that he was seen a month ago and was treated with amoxicillin:pt states that is worked for a couple of days now the symptoms are back  Patient is a 57 y.o. male presenting with ear pain. The history is provided by the patient. No language interpreter was used.  Otalgia Location:  Right Behind ear:  No abnormality Quality:  Pressure Severity:  Moderate Onset quality:  Sudden Duration:  2 months Timing:  Constant Progression:  Unchanged Chronicity:  New Relieved by:  Nothing Worsened by:  Nothing tried Associated symptoms: congestion   Associated symptoms: no fever     Past Medical History  Diagnosis Date  . Hypertension   . Kidney stones   . Sinusitis     History reviewed. No pertinent past surgical history.  No family history on file.  History  Substance Use Topics  . Smoking status: Never Smoker   . Smokeless tobacco: Never Used  . Alcohol Use: No      Review of Systems  Constitutional: Negative for fever.  HENT: Positive for ear pain and congestion.   Respiratory: Negative.   Cardiovascular: Negative.     Allergies  Review of patient's allergies indicates no known allergies.  Home Medications   Current Outpatient Rx  Name  Route  Sig  Dispense  Refill  . amoxicillin (AMOXIL) 500 MG capsule   Oral   Take 1 capsule (500 mg total) by mouth 3 (three) times daily.   21 capsule   0   . Ascorbic Acid (VITAMIN C PO)   Oral   Take 2 tablets by mouth daily.         Marland Kitchen atenolol (TENORMIN) 50 MG tablet   Oral   Take 50 mg by mouth daily.           Marland Kitchen ibuprofen (ADVIL,MOTRIN) 800 MG tablet   Oral   Take 800 mg by mouth once as needed. For pain          . MAGNESIUM OXIDE PO   Oral   Take 1 tablet by mouth daily.         . Multiple Vitamin (MULITIVITAMIN WITH MINERALS) TABS   Oral   Take 1 tablet by mouth daily.         . Omega-3 Fatty Acids (FISH OIL PO)   Oral   Take 1 capsule by mouth daily.         Marland Kitchen oxymetazoline (AFRIN NASAL SPRAY) 0.05 % nasal spray   Nasal   Place 2 sprays into the nose 2 (two) times daily.   30 mL   0   . sulfamethoxazole-trimethoprim (SEPTRA DS) 800-160 MG per tablet   Oral   Take 1 tablet by mouth every 12 (twelve) hours.   14 tablet   0     BP 162/99  Pulse 67  Temp(Src) 98.1 F (36.7 C) (Oral)  Resp 18  Wt 215 lb (97.523 kg)  BMI 27.59 kg/m2  SpO2 100%  Physical Exam  Nursing note and vitals reviewed. Constitutional: He appears well-developed and well-nourished.  HENT:  Head: Normocephalic and atraumatic.  Right Ear: External ear normal.  Left Ear: External ear normal.  Nose: Mucosal edema present. Right sinus exhibits  frontal sinus tenderness.  Eyes: Conjunctivae and EOM are normal. Pupils are equal, round, and reactive to light.  Neck: Normal range of motion. Neck supple.  Cardiovascular: Normal rate and regular rhythm.   Pulmonary/Chest: Effort normal and breath sounds normal.    ED Course  Procedures (including critical care time)  Labs Reviewed - No data to display No results found.   1. Sinusitis       MDM  Will treat for sinusitis        Teressa Lower, NP 09/19/12 1819

## 2012-09-20 NOTE — ED Provider Notes (Signed)
Medical screening examination/treatment/procedure(s) were performed by non-physician practitioner and as supervising physician I was immediately available for consultation/collaboration.  Georjean Toya, MD 09/20/12 0746 

## 2013-07-30 ENCOUNTER — Encounter (HOSPITAL_BASED_OUTPATIENT_CLINIC_OR_DEPARTMENT_OTHER): Payer: Self-pay | Admitting: Emergency Medicine

## 2013-07-30 ENCOUNTER — Emergency Department (HOSPITAL_BASED_OUTPATIENT_CLINIC_OR_DEPARTMENT_OTHER)
Admission: EM | Admit: 2013-07-30 | Discharge: 2013-07-30 | Disposition: A | Discharge status: Home / Self Care | Payer: Managed Care, Other (non HMO) | Attending: Emergency Medicine | Admitting: Emergency Medicine

## 2013-07-30 DIAGNOSIS — I10 Essential (primary) hypertension: Secondary | ICD-10-CM | POA: Insufficient documentation

## 2013-07-30 DIAGNOSIS — R0981 Nasal congestion: Secondary | ICD-10-CM

## 2013-07-30 DIAGNOSIS — J3489 Other specified disorders of nose and nasal sinuses: Secondary | ICD-10-CM | POA: Insufficient documentation

## 2013-07-30 DIAGNOSIS — Z87442 Personal history of urinary calculi: Secondary | ICD-10-CM | POA: Insufficient documentation

## 2013-07-30 DIAGNOSIS — Z79899 Other long term (current) drug therapy: Secondary | ICD-10-CM | POA: Insufficient documentation

## 2013-07-30 DIAGNOSIS — H9209 Otalgia, unspecified ear: Secondary | ICD-10-CM | POA: Insufficient documentation

## 2013-07-30 MED ORDER — AMOXICILLIN 500 MG PO CAPS: 500.0000 mg | ORAL_CAPSULE | Freq: Three times a day (TID) | ORAL | Status: DC

## 2013-07-30 NOTE — ED Notes (Signed)
Pt reports sinus pressure/pain and ear pain x 6 months unrelieved after taking Mucinex, Flonase, or allergy pill.

## 2013-07-30 NOTE — Discharge Instructions (Signed)
Return to the ED with any concerns including fever, increased facial pain, changes in vision, vomiting and not able to keep down liquids or medications, decreased level of alertness/lethargy, or any other alarming symptoms

## 2013-07-30 NOTE — ED Provider Notes (Signed)
CSN: [id number]     Arrival date & time 07/30/13  0719 History   First MD Initiated Contact with Patient 07/30/13 [phone]     Chief Complaint  Patient presents with  . Facial Pain  . Otalgia   (Consider location/radiation/quality/duration/timing/severity/associated sxs/prior Treatment) HPI Pt presenting with c/o facial pain and sinus congestion with bilateral ear pain.  Symptoms have been presents over the past 6 months.  He has been taking mucinex, used flonase but feels he cannot afford this as he does not have insurance.  Yesterday he started taking allegra.  No fever.  Yellowish sinus drainage.  Feels a ringing in his ear.  No specific sick contacts. There are no other associated systemic symptoms, there are no other alleviating or modifying factors.   Past Medical History  Diagnosis Date  . Hypertension   . Kidney stones   . Sinusitis    History reviewed. No pertinent past surgical history. No family history on file. History  Substance Use Topics  . Smoking status: Never Smoker   . Smokeless tobacco: Never Used  . Alcohol Use: No    Review of Systems ROS reviewed and all otherwise negative except for mentioned in HPI  Allergies  Review of patient's allergies indicates no known allergies.  Home Medications   Current Outpatient Rx  Name  Route  Sig  Dispense  Refill  . amoxicillin (AMOXIL) 500 MG capsule   Oral   Take 1 capsule (500 mg total) by mouth 3 (three) times daily.   21 capsule   0   . amoxicillin (AMOXIL) 500 MG capsule   Oral   Take 1 capsule (500 mg total) by mouth 3 (three) times daily.   42 capsule   0   . Ascorbic Acid (VITAMIN C PO)   Oral   Take 2 tablets by mouth daily.         Marland Kitchen. atenolol (TENORMIN) 50 MG tablet   Oral   Take 50 mg by mouth daily.           Marland Kitchen. ibuprofen (ADVIL,MOTRIN) 800 MG tablet   Oral   Take 800 mg by mouth once as needed. For pain         . MAGNESIUM OXIDE PO   Oral   Take 1 tablet by mouth daily.         .  Multiple Vitamin (MULITIVITAMIN WITH MINERALS) TABS   Oral   Take 1 tablet by mouth daily.         . Omega-3 Fatty Acids (FISH OIL PO)   Oral   Take 1 capsule by mouth daily.         Marland Kitchen. oxymetazoline (AFRIN NASAL SPRAY) 0.05 % nasal spray   Nasal   Place 2 sprays into the nose 2 (two) times daily.   30 mL   0   . sulfamethoxazole-trimethoprim (SEPTRA DS) 800-160 MG per tablet   Oral   Take 1 tablet by mouth every 12 (twelve) hours.   14 tablet   0    BP 171/98  Pulse 63  Temp(Src) 98 F (36.7 C) (Oral)  Resp 18  Ht 6\' 2"  (1.88 m)  Wt 210 lb (95.255 kg)  BMI 26.95 kg/m2  SpO2 100% Vitals reivewed Physical Exam Physical Examination: General appearance - alert, well appearing, and in no distress Mental status - alert, oriented to person, place, and time Eyes - no conjunctival injection, no scleral icterus Ears- TMS clear bilaterally, EACs normal bilaterally Mouth - mucous  membranes moist, pharynx normal without lesions Chest - clear to auscultation, no wheezes, rales or rhonchi, symmetric air entry Heart - normal rate, regular rhythm, normal S1, S2, no murmurs, rubs, clicks or gallops Abdomen - soft, nontender, nondistended, no masses or organomegaly Extremities - peripheral pulses normal, no pedal edema, no clubbing or cyanosis Skin - normal coloration and turgor, no rashes  ED Course  Procedures (including critical care time) Labs Review Labs Reviewed - No data to display Imaging Review No results found.  EKG Interpretation   None       MDM   1. Sinus congestion    Pt presenting with c/o ear fullness and congestion over the past 6 months.  No fever.  Cannot afford flonase.  Advised to continue taking allegra as he just started this medication yesterday. Also given rx for amoxicillin as this has helped his similar symptoms one year ago.  Discharged with strict return precautions.  Pt agreeable with plan.    Ethelda Chick, MD 07/31/13 [phone]

## 2014-03-03 ENCOUNTER — Encounter (HOSPITAL_BASED_OUTPATIENT_CLINIC_OR_DEPARTMENT_OTHER): Payer: Self-pay | Admitting: Emergency Medicine

## 2014-03-03 ENCOUNTER — Emergency Department (HOSPITAL_BASED_OUTPATIENT_CLINIC_OR_DEPARTMENT_OTHER)
Admission: EM | Admit: 2014-03-03 | Discharge: 2014-03-03 | Disposition: A | Discharge status: Home / Self Care | Payer: No Typology Code available for payment source | Attending: Emergency Medicine | Admitting: Emergency Medicine

## 2014-03-03 DIAGNOSIS — I10 Essential (primary) hypertension: Secondary | ICD-10-CM | POA: Insufficient documentation

## 2014-03-03 DIAGNOSIS — Z79899 Other long term (current) drug therapy: Secondary | ICD-10-CM | POA: Diagnosis not present

## 2014-03-03 DIAGNOSIS — Z792 Long term (current) use of antibiotics: Secondary | ICD-10-CM | POA: Diagnosis not present

## 2014-03-03 DIAGNOSIS — J0100 Acute maxillary sinusitis, unspecified: Secondary | ICD-10-CM | POA: Diagnosis not present

## 2014-03-03 DIAGNOSIS — Z87442 Personal history of urinary calculi: Secondary | ICD-10-CM | POA: Insufficient documentation

## 2014-03-03 DIAGNOSIS — R51 Headache: Secondary | ICD-10-CM | POA: Diagnosis present

## 2014-03-03 DIAGNOSIS — J0101 Acute recurrent maxillary sinusitis: Secondary | ICD-10-CM

## 2014-03-03 MED ORDER — AMOXICILLIN-POT CLAVULANATE 875-125 MG PO TABS: 1.0000 | ORAL_TABLET | Freq: Two times a day (BID) | ORAL | Status: DC

## 2014-03-03 NOTE — Discharge Instructions (Signed)
Return to the ED with any concerns including fever/chills, vomiting and not able to keep down liquids or antibiotics

## 2014-03-03 NOTE — ED Notes (Signed)
MD at bedside. 

## 2014-03-03 NOTE — ED Notes (Signed)
C/o sinus congestion/pain x 3-4 months

## 2014-03-03 NOTE — ED Provider Notes (Signed)
CSN: [id number]     Arrival date & time 03/03/14  1507 History   First MD Initiated Contact with Patient 03/03/14 1730     Chief Complaint  Patient presents with  . Facial Pain     (Consider location/radiation/quality/duration/timing/severity/associated sxs/prior Treatment) HPI Pt presents with c/o sinus congestion and facial pain over the past 3-4 months. He has hx of sinus infection in the past which was treated with amoxicillin.  Last course of abx was January 2015.  He states this resolved his symptoms.  No fever/chils.  Has had upper teeth pain.  No cough or difficulty breathing.  There are no other associated systemic symptoms, there are no other alleviating or modifying factors.   Past Medical History  Diagnosis Date  . Hypertension   . Kidney stones   . Sinusitis    History reviewed. No pertinent past surgical history. No family history on file. History  Substance Use Topics  . Smoking status: Never Smoker   . Smokeless tobacco: Never Used  . Alcohol Use: No    Review of Systems ROS reviewed and all otherwise negative except for mentioned in HPI    Allergies  Review of patient's allergies indicates no known allergies.  Home Medications   Prior to Admission medications   Medication Sig Start Date End Date Taking? Authorizing Provider  HYDRALAZINE-HCTZ PO Take by mouth.   Yes Historical Provider, MD  METOPROLOL SUCCINATE ER PO Take by mouth.   Yes Historical Provider, MD  amoxicillin (AMOXIL) 500 MG capsule Take 1 capsule (500 mg total) by mouth 3 (three) times daily. 08/11/12   Loren Racer, MD  amoxicillin (AMOXIL) 500 MG capsule Take 1 capsule (500 mg total) by mouth 3 (three) times daily. 07/30/13   Ethelda Chick, MD  amoxicillin-clavulanate (AUGMENTIN) 875-125 MG per tablet Take 1 tablet by mouth every 12 (twelve) hours. 03/03/14   Ethelda Chick, MD  Ascorbic Acid (VITAMIN C PO) Take 2 tablets by mouth daily.    Historical Provider, MD  atenolol (TENORMIN)  50 MG tablet Take 50 mg by mouth daily.      Historical Provider, MD  ibuprofen (ADVIL,MOTRIN) 800 MG tablet Take 800 mg by mouth once as needed. For pain    Historical Provider, MD  MAGNESIUM OXIDE PO Take 1 tablet by mouth daily.    Historical Provider, MD  Multiple Vitamin (MULITIVITAMIN WITH MINERALS) TABS Take 1 tablet by mouth daily.    Historical Provider, MD  Omega-3 Fatty Acids (FISH OIL PO) Take 1 capsule by mouth daily.    Historical Provider, MD  oxymetazoline (AFRIN NASAL SPRAY) 0.05 % nasal spray Place 2 sprays into the nose 2 (two) times daily. 08/11/12   Loren Racer, MD  sulfamethoxazole-trimethoprim (SEPTRA DS) 800-160 MG per tablet Take 1 tablet by mouth every 12 (twelve) hours. 09/19/12   Teressa Lower, NP   BP 164/79  Pulse 95  Temp(Src) 97.8 F (36.6 C) (Oral)  Resp 16  Ht 6\' 2"  (1.88 m)  Wt 200 lb (90.719 kg)  BMI 25.67 kg/m2  SpO2 99% Vitals reviewed Physical Exam Physical Examination: General appearance - alert, well appearing, and in no distress Mental status - alert, oriented to person, place, and time Eyes - no conjunctival injection, no scleral icterus Nose - normal and patent, no erythema, discharge or polyps Face- ttp over maxillary sinuses bilaterally, no ttp of frontal sinuses Mouth - mucous membranes moist, pharynx normal without lesions Chest - clear to auscultation, no wheezes, rales or rhonchi,  symmetric air entry Heart - normal rate, regular rhythm, normal S1, S2, no murmurs, rubs, clicks or gallops Extremities - peripheral pulses normal, no pedal edema, no clubbing or cyanosis Skin - normal coloration and turgor, no rashes  ED Course  Procedures (including critical care time) Labs Review Labs Reviewed - No data to display  Imaging Review No results found.   EKG Interpretation None      MDM   Final diagnoses:  Acute recurrent maxillary sinusitis    Pt presenting with c/o sinus pain and congestion.  Pt with facial tenderness to  palpation.  Pt requesting something other than amoxicillin.  Will prescribe augmentin for sinusitis.  Discharged with strict return precautions.  Pt agreeable with plan.    Ethelda ChickMartha K Linker, MD 03/04/14 [phone]

## 2014-05-11 ENCOUNTER — Encounter (HOSPITAL_BASED_OUTPATIENT_CLINIC_OR_DEPARTMENT_OTHER): Payer: Self-pay | Admitting: Emergency Medicine

## 2014-05-11 ENCOUNTER — Emergency Department (HOSPITAL_BASED_OUTPATIENT_CLINIC_OR_DEPARTMENT_OTHER)
Admission: EM | Admit: 2014-05-11 | Discharge: 2014-05-11 | Discharge status: Against Medical Advice | Payer: No Typology Code available for payment source | Attending: Emergency Medicine | Admitting: Emergency Medicine

## 2014-05-11 DIAGNOSIS — I10 Essential (primary) hypertension: Secondary | ICD-10-CM | POA: Diagnosis present

## 2014-05-11 NOTE — ED Notes (Signed)
Pt presents to ED for hypertension. Pt reports he drank a mixture of red yeast rice, celery and another ingredient that he cant remember. He drank this for indigestion which helped but he thinks this increased his BP.

## 2014-06-27 ENCOUNTER — Encounter (HOSPITAL_BASED_OUTPATIENT_CLINIC_OR_DEPARTMENT_OTHER): Payer: Self-pay | Admitting: *Deleted

## 2014-06-27 ENCOUNTER — Emergency Department (HOSPITAL_BASED_OUTPATIENT_CLINIC_OR_DEPARTMENT_OTHER)
Admission: EM | Admit: 2014-06-27 | Discharge: 2014-06-27 | Disposition: A | Discharge status: Home / Self Care | Payer: Managed Care, Other (non HMO) | Attending: Emergency Medicine | Admitting: Emergency Medicine

## 2014-06-27 ENCOUNTER — Emergency Department (HOSPITAL_BASED_OUTPATIENT_CLINIC_OR_DEPARTMENT_OTHER): Payer: Managed Care, Other (non HMO)

## 2014-06-27 DIAGNOSIS — I10 Essential (primary) hypertension: Secondary | ICD-10-CM | POA: Insufficient documentation

## 2014-06-27 DIAGNOSIS — J069 Acute upper respiratory infection, unspecified: Secondary | ICD-10-CM | POA: Insufficient documentation

## 2014-06-27 DIAGNOSIS — Z792 Long term (current) use of antibiotics: Secondary | ICD-10-CM | POA: Insufficient documentation

## 2014-06-27 DIAGNOSIS — Z87442 Personal history of urinary calculi: Secondary | ICD-10-CM | POA: Insufficient documentation

## 2014-06-27 DIAGNOSIS — Z79899 Other long term (current) drug therapy: Secondary | ICD-10-CM | POA: Insufficient documentation

## 2014-06-27 MED ORDER — DM-GUAIFENESIN ER 30-600 MG PO TB12: 1.0000 | ORAL_TABLET | Freq: Two times a day (BID) | ORAL | Status: DC

## 2014-06-27 NOTE — ED Provider Notes (Signed)
CSN: [id number]     Arrival date & time 06/27/14  1409 History  This chart was scribed for Vanetta MuldersScott Giovanne Nickolson, MD by [place] Peace, ED Scribe. The patient was seen in [facility]. The patient's care was started at 3:23 PM.    Chief Complaint  Patient presents with  . Cough      Patient is a 58 y.o. male presenting with cough. The history is provided by the patient. No language interpreter was used.  Cough Cough characteristics:  Productive Severity:  Moderate Duration:  5 days Associated symptoms: chills, ear pain, fever and sore throat   Associated symptoms: no chest pain, no headaches, no rash and no shortness of breath     HPI Comments: Caleb Hines is a 58 y.o. male who presents to the Emergency Department complaining of severe productive cough onset 5 days ago with associated fever, chills, congestion, and sore throat. No complaints of nausea, vomiting, appetite change, or visual changes. He reports that he hasn't received his flu shot this year. Pt is non-smoker. [facility]    Past Medical History  Diagnosis Date  . Hypertension   . Kidney stones   . Sinusitis    History reviewed. No pertinent past surgical history. No family history on file. History  Substance Use Topics  . Smoking status: Never Smoker   . Smokeless tobacco: Never Used  . Alcohol Use: No    Review of Systems  Constitutional: Positive for fever and chills.  HENT: Positive for congestion, ear pain, sinus pressure and sore throat.   Eyes: Negative for visual disturbance.  Respiratory: Positive for cough. Negative for shortness of breath.   Cardiovascular: Negative for chest pain.  Gastrointestinal: Negative for nausea, vomiting, abdominal pain and diarrhea.  Genitourinary: Negative for dysuria.  Musculoskeletal: Positive for neck stiffness. Negative for joint swelling and arthralgias.  Skin: Negative for rash.  Neurological: Negative for weakness and headaches.   Psychiatric/Behavioral: Negative for confusion.      Allergies  Review of patient's allergies indicates no known allergies.  Home Medications   Prior to Admission medications   Medication Sig Start Date End Date Taking? Authorizing Provider  Ascorbic Acid (VITAMIN C PO) Take 2 tablets by mouth daily.   Yes Historical Provider, MD  atorvastatin (LIPITOR) 10 MG tablet Take 10 mg by mouth daily.   Yes Historical Provider, MD  hydrochlorothiazide (HYDRODIURIL) 25 MG tablet Take 25 mg by mouth daily.   Yes Historical Provider, MD  MAGNESIUM OXIDE PO Take 1 tablet by mouth daily.   Yes Historical Provider, MD  METOPROLOL SUCCINATE ER PO Take by mouth.   Yes Historical Provider, MD  Multiple Vitamin (MULITIVITAMIN WITH MINERALS) TABS Take 1 tablet by mouth daily.   Yes Historical Provider, MD  Omega-3 Fatty Acids (FISH OIL PO) Take 1 capsule by mouth daily.   Yes Historical Provider, MD  amoxicillin (AMOXIL) 500 MG capsule Take 1 capsule (500 mg total) by mouth 3 (three) times daily. 08/11/12   Loren Raceravid Yelverton, MD  amoxicillin (AMOXIL) 500 MG capsule Take 1 capsule (500 mg total) by mouth 3 (three) times daily. 07/30/13   Ethelda ChickMartha K Linker, MD  amoxicillin-clavulanate (AUGMENTIN) 875-125 MG per tablet Take 1 tablet by mouth every 12 (twelve) hours. 03/03/14   Ethelda ChickMartha K Linker, MD  atenolol (TENORMIN) 50 MG tablet Take 50 mg by mouth daily.      Historical Provider, MD  dextromethorphan-guaiFENesin (MUCINEX DM) 30-600 MG per 12 hr tablet Take 1 tablet  by mouth 2 (two) times daily. 06/27/14   Vanetta MuldersScott Ilynn Stauffer, MD  HYDRALAZINE-HCTZ PO Take by mouth.    Historical Provider, MD  ibuprofen (ADVIL,MOTRIN) 800 MG tablet Take 800 mg by mouth once as needed. For pain    Historical Provider, MD  oxymetazoline (AFRIN NASAL SPRAY) 0.05 % nasal spray Place 2 sprays into the nose 2 (two) times daily. 08/11/12   Loren Raceravid Yelverton, MD  sulfamethoxazole-trimethoprim (SEPTRA DS) 800-160 MG per tablet Take 1 tablet by mouth  every 12 (twelve) hours. 09/19/12   Teressa LowerVrinda Pickering, NP   BP 167/90 mmHg  Pulse 73  Temp(Src) 98.3 F (36.8 C) (Oral)  Resp 18  Ht 6\' 2"  (1.88 m)  Wt 200 lb (90.719 kg)  BMI 25.67 kg/m2  SpO2 100% Physical Exam  Constitutional: He is oriented to person, place, and time.  Eyes: Conjunctivae and EOM are normal. Pupils are equal, round, and reactive to light. No scleral icterus.  Cardiovascular: Normal rate, regular rhythm and normal heart sounds.   No murmur heard. Pulmonary/Chest: Breath sounds normal. He has no wheezes. He has no rales.  Abdominal: Bowel sounds are normal. There is no tenderness.  Neurological: He is oriented to person, place, and time. No cranial nerve deficit. He exhibits normal muscle tone. Coordination normal.    ED Course  Procedures (including critical care time) Labs Review Labs Reviewed - No data to display  Imaging Review Dg Chest 2 View  06/27/2014   CLINICAL DATA:  Acute cough and fever since Monday.  EXAM: CHEST  2 VIEW  COMPARISON:  None.  FINDINGS: The heart size and mediastinal contours are within normal limits. Both lungs are clear. The visualized skeletal structures are unremarkable.  IMPRESSION: No active cardiopulmonary disease.   Electronically Signed   By: Ruel Favorsrevor  Shick M.D.   On: 06/27/2014 15:18     EKG Interpretation None     Medications - No data to display  3:29 PM- Treatment plan was discussed with patient who verbalizes understanding and agrees.   MDM   Final diagnoses:  URI (upper respiratory infection)    Patient nontoxic no acute distress. Chest x-ray negative for pneumonia. Symptoms consistent with upper respiratory infection. Possible could be mild flulike illness. Will treat with Mucinex DM for the cough and congestion.  I personally performed the services described in this documentation, which was scribed in my presence. The recorded information has been reviewed and is accurate.     Vanetta MuldersScott Arbutus Nelligan, MD 06/27/14  [phone]

## 2014-06-27 NOTE — Discharge Instructions (Signed)
Chest x-ray negative no signs of pneumonia. Take Mucinex DM for the cough and congestion. Return for any new or worse symptoms or follow-up with your regular doctor. Work note provided for the next few days.

## 2014-06-27 NOTE — ED Notes (Signed)
Productive cough since monday

## 2014-10-15 ENCOUNTER — Encounter (HOSPITAL_BASED_OUTPATIENT_CLINIC_OR_DEPARTMENT_OTHER): Payer: Self-pay | Admitting: Emergency Medicine

## 2014-10-15 ENCOUNTER — Emergency Department (HOSPITAL_BASED_OUTPATIENT_CLINIC_OR_DEPARTMENT_OTHER)
Admission: EM | Admit: 2014-10-15 | Discharge: 2014-10-15 | Disposition: A | Discharge status: Home / Self Care | Payer: Managed Care, Other (non HMO) | Attending: Emergency Medicine | Admitting: Emergency Medicine

## 2014-10-15 DIAGNOSIS — Z79899 Other long term (current) drug therapy: Secondary | ICD-10-CM | POA: Insufficient documentation

## 2014-10-15 DIAGNOSIS — Z87442 Personal history of urinary calculi: Secondary | ICD-10-CM | POA: Insufficient documentation

## 2014-10-15 DIAGNOSIS — Z792 Long term (current) use of antibiotics: Secondary | ICD-10-CM | POA: Insufficient documentation

## 2014-10-15 DIAGNOSIS — H9201 Otalgia, right ear: Secondary | ICD-10-CM | POA: Insufficient documentation

## 2014-10-15 DIAGNOSIS — Z8709 Personal history of other diseases of the respiratory system: Secondary | ICD-10-CM | POA: Insufficient documentation

## 2014-10-15 DIAGNOSIS — I10 Essential (primary) hypertension: Secondary | ICD-10-CM | POA: Insufficient documentation

## 2014-10-15 MED ORDER — FLUTICASONE PROPIONATE 50 MCG/ACT NA SUSP: 2.0000 | Freq: Every day | NASAL | Status: DC

## 2014-10-15 MED ORDER — IBUPROFEN 800 MG PO TABS: 800.0000 mg | ORAL_TABLET | Freq: Three times a day (TID) | ORAL | Status: DC

## 2014-10-15 MED ORDER — IBUPROFEN 800 MG PO TABS
800.0000 mg | ORAL_TABLET | Freq: Once | ORAL | Status: AC
  Administered 2014-10-15: 800 mg via ORAL
  Filled 2014-10-15: qty 1

## 2014-10-15 NOTE — ED Provider Notes (Signed)
CSN: [id number]     Arrival date & time 10/15/14  0403 History   First MD Initiated Contact with Patient 10/15/14 0414     No chief complaint on file.    (Consider location/radiation/quality/duration/timing/severity/associated sxs/prior Treatment) Patient is a 59 y.o. male presenting with ear pain. The history is provided by the patient.  Otalgia Location:  Right Behind ear:  No abnormality Quality:  Aching Severity:  Severe Onset quality:  Gradual Timing:  Constant Progression:  Unchanged Chronicity:  New Context: not direct blow   Relieved by:  Nothing Worsened by:  Nothing tried Ineffective treatments:  None tried Associated symptoms: no fever and no rhinorrhea   Risk factors: no prior ear surgery     Past Medical History  Diagnosis Date  . Hypertension   . Kidney stones   . Sinusitis    History reviewed. No pertinent past surgical history. History reviewed. No pertinent family history. History  Substance Use Topics  . Smoking status: Never Smoker   . Smokeless tobacco: Never Used  . Alcohol Use: No    Review of Systems  Constitutional: Negative for fever.  HENT: Positive for ear pain. Negative for rhinorrhea.   All other systems reviewed and are negative.     Allergies  Review of patient's allergies indicates no known allergies.  Home Medications   Prior to Admission medications   Medication Sig Start Date End Date Taking? Authorizing Provider  amoxicillin (AMOXIL) 500 MG capsule Take 1 capsule (500 mg total) by mouth 3 (three) times daily. 08/11/12   Loren Raceravid Yelverton, MD  amoxicillin (AMOXIL) 500 MG capsule Take 1 capsule (500 mg total) by mouth 3 (three) times daily. 07/30/13   Jerelyn ScottMartha Linker, MD  amoxicillin-clavulanate (AUGMENTIN) 875-125 MG per tablet Take 1 tablet by mouth every 12 (twelve) hours. 03/03/14   Jerelyn ScottMartha Linker, MD  Ascorbic Acid (VITAMIN C PO) Take 2 tablets by mouth daily.    Historical Provider, MD  atenolol (TENORMIN) 50 MG tablet Take 50  mg by mouth daily.      Historical Provider, MD  atorvastatin (LIPITOR) 10 MG tablet Take 10 mg by mouth daily.    Historical Provider, MD  dextromethorphan-guaiFENesin (MUCINEX DM) 30-600 MG per 12 hr tablet Take 1 tablet by mouth 2 (two) times daily. 06/27/14   Vanetta MuldersScott Zackowski, MD  HYDRALAZINE-HCTZ PO Take by mouth.    Historical Provider, MD  hydrochlorothiazide (HYDRODIURIL) 25 MG tablet Take 25 mg by mouth daily.    Historical Provider, MD  ibuprofen (ADVIL,MOTRIN) 800 MG tablet Take 800 mg by mouth once as needed. For pain    Historical Provider, MD  MAGNESIUM OXIDE PO Take 1 tablet by mouth daily.    Historical Provider, MD  METOPROLOL SUCCINATE ER PO Take by mouth.    Historical Provider, MD  Multiple Vitamin (MULITIVITAMIN WITH MINERALS) TABS Take 1 tablet by mouth daily.    Historical Provider, MD  Omega-3 Fatty Acids (FISH OIL PO) Take 1 capsule by mouth daily.    Historical Provider, MD  oxymetazoline (AFRIN NASAL SPRAY) 0.05 % nasal spray Place 2 sprays into the nose 2 (two) times daily. 08/11/12   Loren Raceravid Yelverton, MD  sulfamethoxazole-trimethoprim (SEPTRA DS) 800-160 MG per tablet Take 1 tablet by mouth every 12 (twelve) hours. 09/19/12   Teressa LowerVrinda Pickering, NP   BP 179/103 mmHg  Pulse 66  Temp(Src) 99.5 F (37.5 C) (Oral)  Resp 18  Ht 6\' 2"  (1.88 m)  Wt 210 lb (95.255 kg)  BMI 26.95 kg/m2  SpO2 100% Physical Exam  Constitutional: He is oriented to person, place, and time. He appears well-developed and well-nourished. No distress.  HENT:  Head: Normocephalic and atraumatic.  Right Ear: No mastoid tenderness. Tympanic membrane is not injected, not scarred, not perforated, not erythematous, not retracted and not bulging. No hemotympanum.  Left Ear: No mastoid tenderness. Tympanic membrane is not injected, not scarred, not perforated, not erythematous, not retracted and not bulging. No hemotympanum.  Mouth/Throat: Oropharynx is clear and moist.  Eyes: Conjunctivae and EOM are  normal. Pupils are equal, round, and reactive to light.  Neck: Normal range of motion. Neck supple. No tracheal deviation present.  Cardiovascular: Normal rate and regular rhythm.   Pulmonary/Chest: Effort normal and breath sounds normal. No respiratory distress. He has no wheezes. He has no rales.  Abdominal: Soft. Bowel sounds are normal. There is no tenderness. There is no rebound and no guarding.  Musculoskeletal: Normal range of motion.  Lymphadenopathy:    He has no cervical adenopathy.  Neurological: He is alert and oriented to person, place, and time.  Skin: Skin is warm and dry.  Psychiatric: He has a normal mood and affect.    ED Course  Procedures (including critical care time) Labs Review Labs Reviewed - No data to display  Imaging Review No results found.   EKG Interpretation None      MDM   Final diagnoses:  Otalgia, right   Likely coming from nasal congestion.  Will place patient on NSAIDs and flonase and have follow up with PMD.  No signs of infection at this time    Johnika Escareno, MD 10/15/14 [phone]

## 2014-10-15 NOTE — ED Notes (Signed)
Rt ear pressure onset about 2 weeks ago

## 2014-10-15 NOTE — ED Notes (Signed)
Rt ear pressure x 2 weeks  Denies pain

## 2015-06-23 IMAGING — CR DG CHEST 2V
2 series · 2 of 2 positions shown · non-contrast
Comparison: None.

CLINICAL DATA: Acute cough and fever since [REDACTED].

EXAM:
CHEST  2 VIEW

[w chest pa]
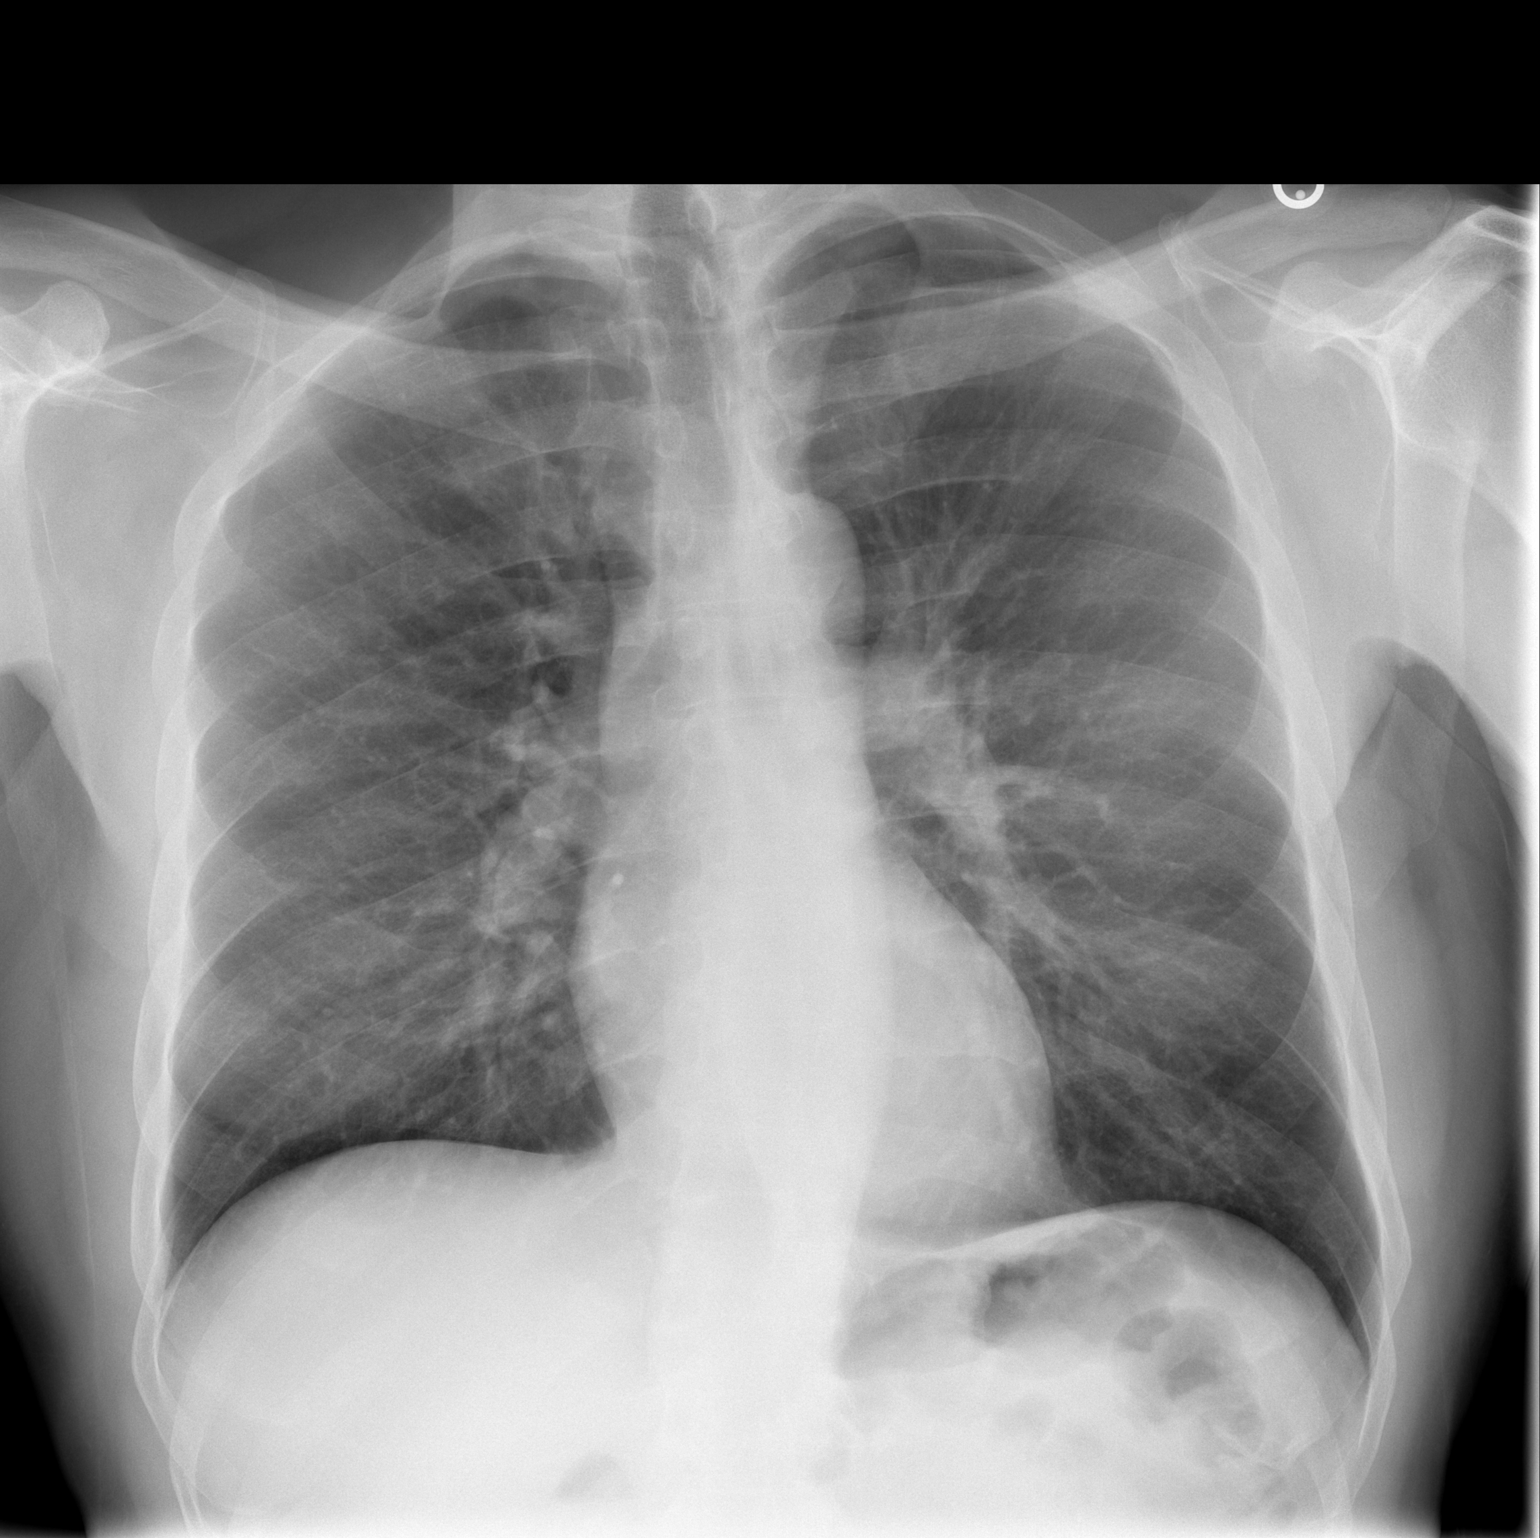

[w chest lat]
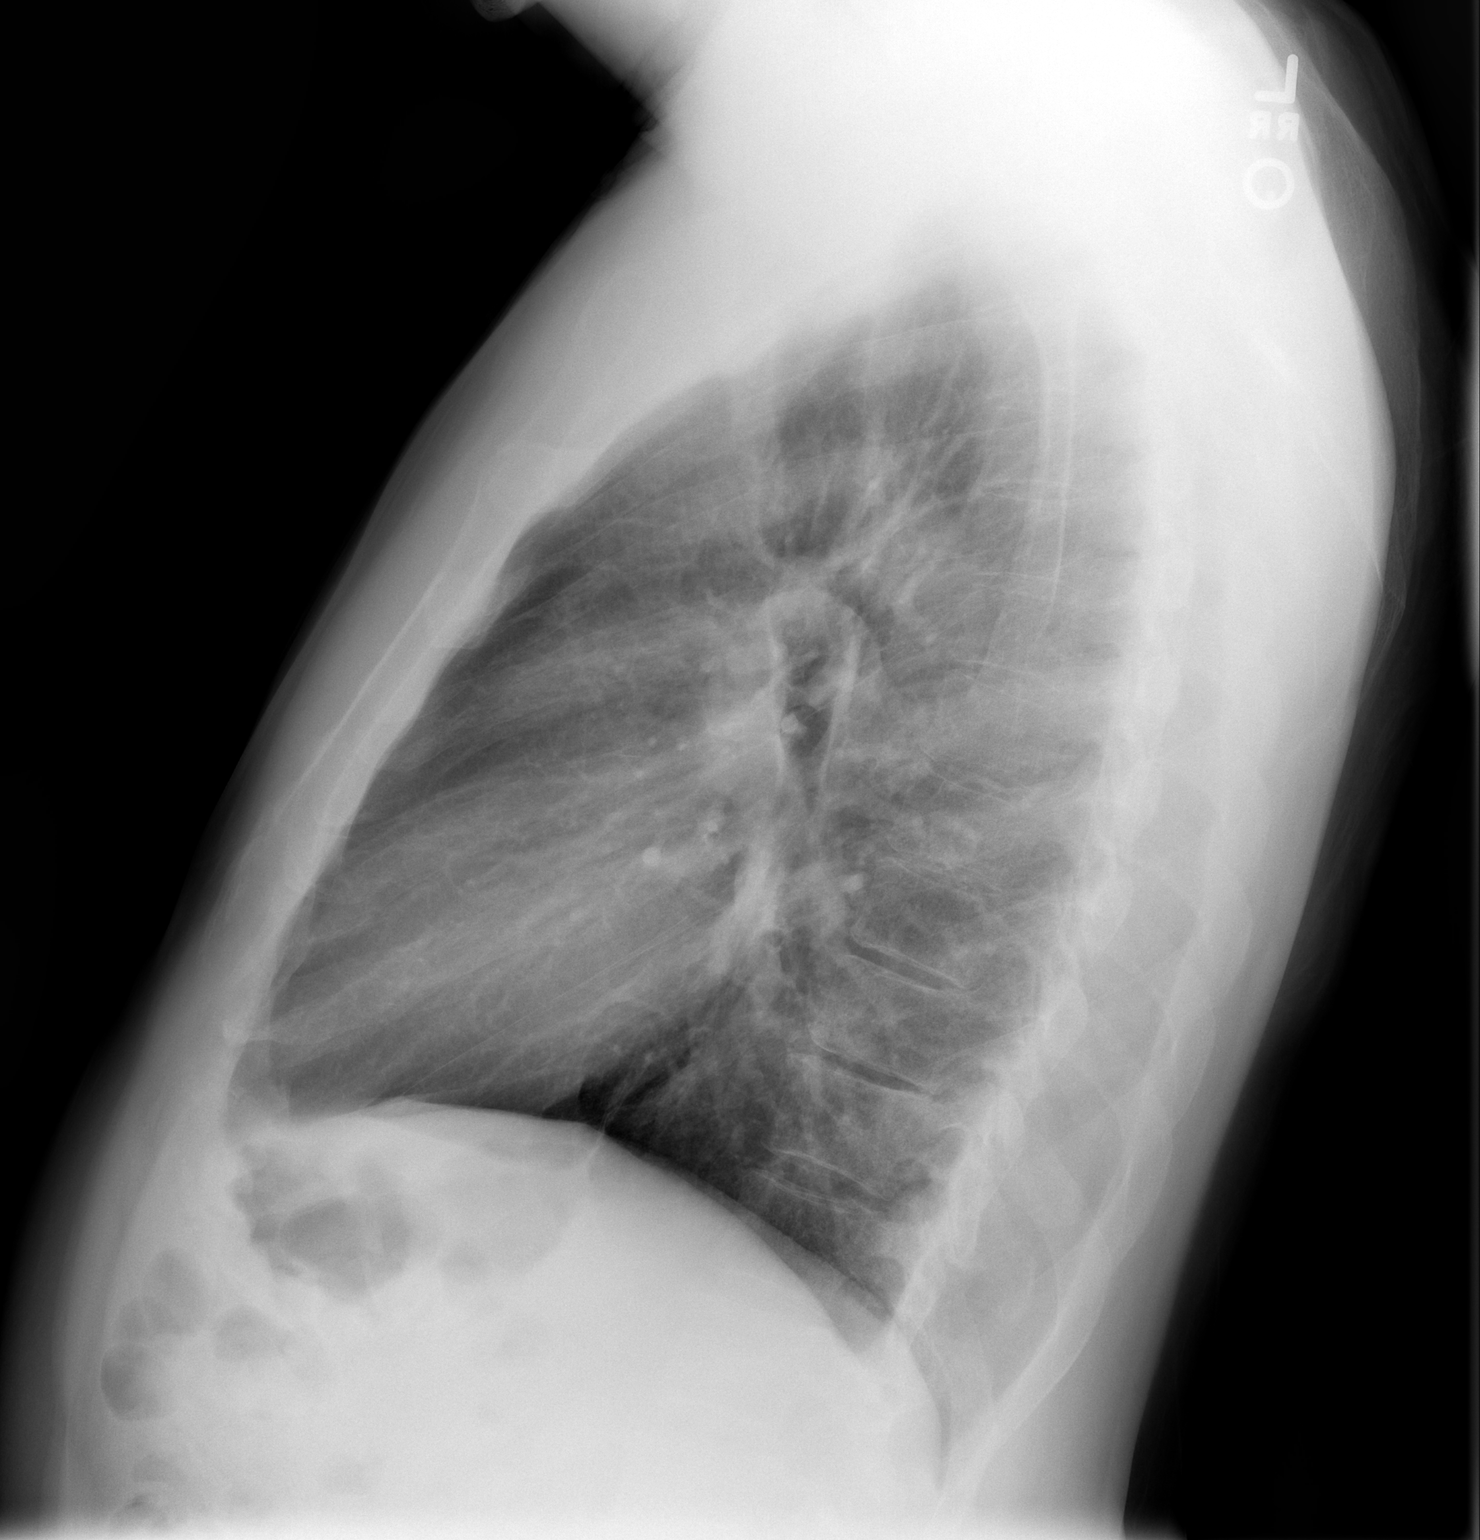

[2 of 2 positions shown; findings below may reference images not displayed]

FINDINGS: The heart size and mediastinal contours are within normal limits.
Both lungs are clear. The visualized skeletal structures are
unremarkable.
IMPRESSION: No active cardiopulmonary disease.

## 2015-10-28 ENCOUNTER — Emergency Department (HOSPITAL_COMMUNITY)
Admission: EM | Admit: 2015-10-28 | Discharge: 2015-10-28 | Disposition: A | Discharge status: Home / Self Care | Payer: BLUE CROSS/BLUE SHIELD | Attending: Emergency Medicine | Admitting: Emergency Medicine

## 2015-10-28 ENCOUNTER — Encounter (HOSPITAL_COMMUNITY): Payer: Self-pay | Admitting: Emergency Medicine

## 2015-10-28 DIAGNOSIS — Z79899 Other long term (current) drug therapy: Secondary | ICD-10-CM | POA: Insufficient documentation

## 2015-10-28 DIAGNOSIS — H9209 Otalgia, unspecified ear: Secondary | ICD-10-CM | POA: Insufficient documentation

## 2015-10-28 DIAGNOSIS — Z87442 Personal history of urinary calculi: Secondary | ICD-10-CM | POA: Insufficient documentation

## 2015-10-28 DIAGNOSIS — R509 Fever, unspecified: Secondary | ICD-10-CM

## 2015-10-28 DIAGNOSIS — J069 Acute upper respiratory infection, unspecified: Secondary | ICD-10-CM | POA: Insufficient documentation

## 2015-10-28 DIAGNOSIS — I10 Essential (primary) hypertension: Secondary | ICD-10-CM | POA: Diagnosis not present

## 2015-10-28 MED ORDER — FLUTICASONE PROPIONATE 50 MCG/ACT NA SUSP: 1.0000 | Freq: Every day | NASAL | Status: AC

## 2015-10-28 MED ORDER — AMOXICILLIN 500 MG PO CAPS: 500.0000 mg | ORAL_CAPSULE | Freq: Three times a day (TID) | ORAL | Status: DC

## 2015-10-28 MED ORDER — BENZONATATE 100 MG PO CAPS: 100.0000 mg | ORAL_CAPSULE | Freq: Three times a day (TID) | ORAL | Status: AC

## 2015-10-28 NOTE — Discharge Instructions (Signed)
Fever, Adult °A fever is an increase in the body's temperature. It is usually defined as a temperature of 100°F (38°C) or higher. Brief mild or moderate fevers generally have no long-term effects, and they often do not require treatment. Moderate or high fevers may make you feel uncomfortable and can sometimes be a sign of a serious illness or disease. The sweating that may occur with repeated or prolonged fever may also cause dehydration. °Fever is confirmed by taking a temperature with a thermometer. A measured temperature can vary with: °· Age. °· Time of day. °· Location of the thermometer: °· Mouth (oral). °· Rectum (rectal). °· Ear (tympanic). °· Underarm (axillary). °· Forehead (temporal). °HOME CARE INSTRUCTIONS °Pay attention to any changes in your symptoms. Take these actions to help with your condition: °· Take over-the counter and prescription medicines only as told by your health care provider. Follow the dosing instructions carefully. °· If you were prescribed an antibiotic medicine, take it as told by your health care provider. Do not stop taking the antibiotic even if you start to feel better. °· Rest as needed. °· Drink enough fluid to keep your urine clear or pale yellow. This helps to prevent dehydration. °· Sponge yourself or bathe with room-temperature water to help reduce your body temperature as needed. Do not use ice water. °· Do not overbundle yourself in blankets or heavy clothes. °SEEK MEDICAL CARE IF: °· You vomit. °· You cannot eat or drink without vomiting. °· You have diarrhea. °· You have pain when you urinate. °· Your symptoms do not improve with treatment. °· You develop new symptoms. °· You develop excessive weakness. °SEEK IMMEDIATE MEDICAL CARE IF: °· You have shortness of breath or have trouble breathing. °· You are dizzy or you faint. °· You are disoriented or confused. °· You develop signs of dehydration, such as a dry mouth, decreased urination, or paleness. °· You develop  severe pain in your abdomen. °· You have persistent vomiting or diarrhea. °· You develop a skin rash. °· Your symptoms suddenly get worse. °  °This information is not intended to replace advice given to you by your health care provider. Make sure you discuss any questions you have with your health care provider. °  °Document Released: 01/03/2001 Document Revised: 03/31/2015 Document Reviewed: 09/03/2014 °Elsevier Interactive Patient Education ©2016 [employer] °Upper Respiratory Infection, Adult °Most upper respiratory infections (URIs) are a viral infection of the air passages leading to the lungs. A URI affects the nose, throat, and upper air passages. The most common type of URI is nasopharyngitis and is typically referred to as "the common cold." °URIs run their course and usually go away on their own. Most of the time, a URI does not require medical attention, but sometimes a bacterial infection in the upper airways can follow a viral infection. This is called a secondary infection. Sinus and middle ear infections are common types of secondary upper respiratory infections. °Bacterial pneumonia can also complicate a URI. A URI can worsen asthma and chronic obstructive pulmonary disease (COPD). Sometimes, these complications can require emergency medical care and may be life threatening.  °CAUSES °Almost all URIs are caused by viruses. A virus is a type of germ and can spread from one person to another.  °RISKS FACTORS °You may be at risk for a URI if:  °· You smoke.   °· You have chronic heart or lung disease. °· You have a weakened defense (immune) system.   °· You are very young or   very old.   °· You have nasal allergies or asthma. °· You work in crowded or poorly ventilated areas. °· You work in health care facilities or schools. °SIGNS AND SYMPTOMS  °Symptoms typically develop 2-3 days after you come in contact with a cold virus. Most viral URIs last 7-10 days. However, viral URIs from the influenza virus  (flu virus) can last 14-18 days and are typically more severe. Symptoms may include:  °· Runny or stuffy (congested) nose.   °· Sneezing.   °· Cough.   °· Sore throat.   °· Headache.   °· Fatigue.   °· Fever.   °· Loss of appetite.   °· Pain in your forehead, behind your eyes, and over your cheekbones (sinus pain). °· Muscle aches.   °DIAGNOSIS  °Your health care provider may diagnose a URI by: °· Physical exam. °· Tests to check that your symptoms are not due to another condition such as: °¨ Strep throat. °¨ Sinusitis. °¨ Pneumonia. °¨ Asthma. °TREATMENT  °A URI goes away on its own with time. It cannot be cured with medicines, but medicines may be prescribed or recommended to relieve symptoms. Medicines may help: °· Reduce your fever. °· Reduce your cough. °· Relieve nasal congestion. °HOME CARE INSTRUCTIONS  °· Take medicines only as directed by your health care provider.   °· Gargle warm saltwater or take cough drops to comfort your throat as directed by your health care provider. °· Use a warm mist humidifier or inhale steam from a shower to increase air moisture. This may make it easier to breathe. °· Drink enough fluid to keep your urine clear or pale yellow.   °· Eat soups and other clear broths and maintain good nutrition.   °· Rest as needed.   °· Return to work when your temperature has returned to normal or as your health care provider advises. You may need to stay home longer to avoid infecting others. You can also use a face mask and careful hand washing to prevent spread of the virus. °· Increase the usage of your inhaler if you have asthma.   °· Do not use any tobacco products, including cigarettes, chewing tobacco, or electronic cigarettes. If you need help quitting, ask your health care provider. °PREVENTION  °The best way to protect yourself from getting a cold is to practice good hygiene.  °· Avoid oral or hand contact with people with cold symptoms.   °· Wash your hands often if contact occurs.    °There is no clear evidence that vitamin C, vitamin E, echinacea, or exercise reduces the chance of developing a cold. However, it is always recommended to get plenty of rest, exercise, and practice good nutrition.  °SEEK MEDICAL CARE IF:  °· You are getting worse rather than better.   °· Your symptoms are not controlled by medicine.   °· You have chills. °· You have worsening shortness of breath. °· You have brown or red mucus. °· You have yellow or brown nasal discharge. °· You have pain in your face, especially when you bend forward. °· You have a fever. °· You have swollen neck glands. °· You have pain while swallowing. °· You have white areas in the back of your throat. °SEEK IMMEDIATE MEDICAL CARE IF:  °· You have severe or persistent: °¨ Headache. °¨ Ear pain. °¨ Sinus pain. °¨ Chest pain. °· You have chronic lung disease and any of the following: °¨ Wheezing. °¨ Prolonged cough. °¨ Coughing up blood. °¨ A change in your usual mucus. °· You have a stiff neck. °· You have changes in   your: °¨ Vision. °¨ Hearing. °¨ Thinking. °¨ Mood. °MAKE SURE YOU:  °· Understand these instructions. °· Will watch your condition. °· Will get help right away if you are not doing well or get worse. °  °This information is not intended to replace advice given to you by your health care provider. Make sure you discuss any questions you have with your health care provider. °  °Document Released: 01/03/2001 Document Revised: 11/24/2014 Document Reviewed: 10/15/2013 °Elsevier Interactive Patient Education ©2016 [employer] ° °

## 2015-10-28 NOTE — ED Provider Notes (Signed)
CSN: [id number]     Arrival date & time 10/28/15  0127 History  By signing my name below, I, Bethel BornBritney Hines, attest that this documentation has been prepared under the direction and in the presence of Gilda Creasehristopher J Pollina, MD. Electronically Signed: Bethel BornBritney Hines, ED Scribe. 10/28/2015. 2:45 AM   Chief Complaint  Patient presents with  . Fever   The history is provided by the patient. No language interpreter was used.   Caleb Hines is a 60 y.o. male who presents to the Emergency Department complaining of a fever with onset yesterday. Tylenol and aspirin provided some relief in his fever at home. Associated symptoms include sore throat, nasal congestion,sinus pain, ear discomfort ("like fluid"), and a mild cough productive of yellow sputum. He has had similar symptoms in the past related to seasonal allergies. He states that he took a castor oil laxative and became dehydrated.  Pt denies abdominal pain and vomiting.    Past Medical History  Diagnosis Date  . Hypertension   . Kidney stones   . Sinusitis    History reviewed. No pertinent past surgical history. History reviewed. No pertinent family history. Social History  Substance Use Topics  . Smoking status: Never Smoker   . Smokeless tobacco: Never Used  . Alcohol Use: No    Review of Systems  Constitutional: Positive for fever.  HENT: Positive for congestion, ear pain, sinus pressure and sore throat.   Respiratory: Positive for cough.   Gastrointestinal: Negative for vomiting and abdominal pain.  All other systems reviewed and are negative.  Allergies  Review of patient's allergies indicates no known allergies.  Home Medications   Prior to Admission medications   Medication Sig Start Date End Date Taking? Authorizing Provider  amoxicillin (AMOXIL) 500 MG capsule Take 1 capsule (500 mg total) by mouth 3 (three) times daily. 10/28/15   Gilda Creasehristopher J Pollina, MD  Ascorbic Acid (VITAMIN C PO) Take 2 tablets by mouth  daily.    Historical Provider, MD  atenolol (TENORMIN) 50 MG tablet Take 50 mg by mouth daily.      Historical Provider, MD  atorvastatin (LIPITOR) 10 MG tablet Take 10 mg by mouth daily.    Historical Provider, MD  benzonatate (TESSALON) 100 MG capsule Take 1 capsule (100 mg total) by mouth every 8 (eight) hours. 10/28/15   Gilda Creasehristopher J Pollina, MD  fluticasone (FLONASE) 50 MCG/ACT nasal spray Place 1 spray into both nostrils daily. 10/28/15   Gilda Creasehristopher J Pollina, MD  HYDRALAZINE-HCTZ PO Take by mouth.    Historical Provider, MD  hydrochlorothiazide (HYDRODIURIL) 25 MG tablet Take 25 mg by mouth daily.    Historical Provider, MD  MAGNESIUM OXIDE PO Take 1 tablet by mouth daily.    Historical Provider, MD  METOPROLOL SUCCINATE ER PO Take by mouth.    Historical Provider, MD  Multiple Vitamin (MULITIVITAMIN WITH MINERALS) TABS Take 1 tablet by mouth daily.    Historical Provider, MD  Omega-3 Fatty Acids (FISH OIL PO) Take 1 capsule by mouth daily.    Historical Provider, MD   BP 127/74 mmHg  Pulse 93  Temp(Src) 99.3 F (37.4 C) (Oral)  Resp 18  Ht 6\' 2"  (1.88 m)  Wt 210 lb (95.255 kg)  BMI 26.95 kg/m2  SpO2 96% Physical Exam  Constitutional: He is oriented to person, place, and time. He appears well-developed and well-nourished. No distress.  HENT:  Head: Normocephalic and atraumatic.  Right Ear: Hearing normal.  Left Ear: Hearing normal.  Nose: Right  sinus exhibits maxillary sinus tenderness. Left sinus exhibits maxillary sinus tenderness.  Mouth/Throat: Mucous membranes are normal. Posterior oropharyngeal erythema present. No oropharyngeal exudate or posterior oropharyngeal edema.  Eyes: Conjunctivae and EOM are normal. Pupils are equal, round, and reactive to light.  Neck: Normal range of motion. Neck supple.  Cardiovascular: Regular rhythm, S1 normal and S2 normal.  Exam reveals no gallop and no friction rub.   No murmur heard. Pulmonary/Chest: Effort normal and breath sounds  normal. No respiratory distress. He exhibits no tenderness.  Abdominal: Soft. Normal appearance and bowel sounds are normal. There is no hepatosplenomegaly. There is no tenderness. There is no rebound, no guarding, no tenderness at McBurney's point and negative Murphy's sign. No hernia.  Musculoskeletal: Normal range of motion.  Neurological: He is alert and oriented to person, place, and time. He has normal strength. No cranial nerve deficit or sensory deficit. Coordination normal. GCS eye subscore is 4. GCS verbal subscore is 5. GCS motor subscore is 6.  Skin: Skin is warm, dry and intact. No rash noted. No cyanosis.  Psychiatric: He has a normal mood and affect. His speech is normal and behavior is normal. Thought content normal.  Nursing note and vitals reviewed.   ED Course  Procedures (including critical care time) DIAGNOSTIC STUDIES: Oxygen Saturation is 96% on RA,  normal by my interpretation.    COORDINATION OF CARE: 2:41 AM Discussed treatment plan which includes discharge with symptomatic treatment with pt at bedside and pt agreed to plan.  Labs Review Labs Reviewed - No data to display  Imaging Review No results found.    EKG Interpretation None      MDM   Final diagnoses:  URI (upper respiratory infection)  Fever, unspecified fever cause   Presents to the emergency department for evaluation of upper respiratory infection symptoms. Symptoms began ongoing for several days. He has been feeling like he has fluid in his years and has had a scratchy throat with sinus congestion and cough. Has been running a fever at home, to take Tylenol prior to arrival in the ER. Patient reports that he gets similar symptoms every year around this time. This could be allergic, however, fever would not be consistent with allergic response. His lungs are clear, no concern for pneumonia. Examination is reassuring, no signs of any significant infection.  I personally performed the services  described in this documentation, which was scribed in my presence. The recorded information has been reviewed and is accurate.    Gilda Crease, MD 10/28/15 [phone]

## 2015-10-28 NOTE — ED Notes (Signed)
Pt states he started having a sore throat and felt nasally  Pt states today he took some castor oil and thinks that may have dehydrated him some  Pt states yesterday and today he has been running a fever

## 2021-06-02 ENCOUNTER — Other Ambulatory Visit: Payer: Self-pay

## 2021-06-02 ENCOUNTER — Emergency Department (HOSPITAL_COMMUNITY)
Admission: EM | Admit: 2021-06-02 | Discharge: 2021-06-02 | Disposition: A | Discharge status: Home / Self Care | Payer: Medicare Other | Attending: Emergency Medicine | Admitting: Emergency Medicine

## 2021-06-02 DIAGNOSIS — I10 Essential (primary) hypertension: Secondary | ICD-10-CM | POA: Diagnosis not present

## 2021-06-02 DIAGNOSIS — Z79899 Other long term (current) drug therapy: Secondary | ICD-10-CM | POA: Diagnosis not present

## 2021-06-02 DIAGNOSIS — R339 Retention of urine, unspecified: Secondary | ICD-10-CM | POA: Insufficient documentation

## 2021-06-02 DIAGNOSIS — K5900 Constipation, unspecified: Secondary | ICD-10-CM | POA: Insufficient documentation

## 2021-06-02 DIAGNOSIS — R1030 Lower abdominal pain, unspecified: Secondary | ICD-10-CM

## 2021-06-02 LAB — CBC
HCT: 44.8 % (ref 39.0–52.0)
Hemoglobin: 14.6 g/dL (ref 13.0–17.0)
MCH: 29.8 pg (ref 26.0–34.0)
MCHC: 32.6 g/dL (ref 30.0–36.0)
MCV: 91.4 fL (ref 80.0–100.0)
Platelets: 205 10*3/uL (ref 150–400)
RBC: 4.9 MIL/uL (ref 4.22–5.81)
RDW: 12.8 % (ref 11.5–15.5)
WBC: 6.9 10*3/uL (ref 4.0–10.5)
nRBC: 0 % (ref 0.0–0.2)

## 2021-06-02 LAB — URINALYSIS, ROUTINE W REFLEX MICROSCOPIC
Bilirubin Urine: NEGATIVE
Glucose, UA: NEGATIVE mg/dL
Hgb urine dipstick: NEGATIVE
Ketones, ur: NEGATIVE mg/dL
Leukocytes,Ua: NEGATIVE
Nitrite: NEGATIVE
Protein, ur: NEGATIVE mg/dL
Specific Gravity, Urine: 1.009 (ref 1.005–1.030)
pH: 5 (ref 5.0–8.0)

## 2021-06-02 LAB — BASIC METABOLIC PANEL
Anion gap: 9 (ref 5–15)
BUN: 7 mg/dL — ABNORMAL LOW (ref 8–23)
CO2: 26 mmol/L (ref 22–32)
Calcium: 9.5 mg/dL (ref 8.9–10.3)
Chloride: 96 mmol/L — ABNORMAL LOW (ref 98–111)
Creatinine, Ser: 1 mg/dL (ref 0.61–1.24)
GFR, Estimated: >60 mL/min (ref >60)
Glucose, Bld: 125 mg/dL — ABNORMAL HIGH (ref 70–99)
Potassium: 3.6 mmol/L (ref 3.5–5.1)
Sodium: 131 mmol/L — ABNORMAL LOW (ref 135–145)

## 2021-06-02 MED ORDER — MORPHINE SULFATE (PF) 4 MG/ML IV SOLN
4.0000 mg | Freq: Once | INTRAVENOUS | Status: AC
  Administered 2021-06-02: 4 mg via INTRAVENOUS
  Filled 2021-06-02: qty 1

## 2021-06-02 MED ORDER — LABETALOL HCL 5 MG/ML IV SOLN
10.0000 mg | Freq: Once | INTRAVENOUS | Status: AC
  Administered 2021-06-02: 10 mg via INTRAVENOUS
  Filled 2021-06-02: qty 4

## 2021-06-02 MED ORDER — POLYETHYLENE GLYCOL 3350 17 G PO PACK: 17.0000 g | PACK | Freq: Every day | ORAL | 0 refills | Status: AC

## 2021-06-02 MED ORDER — TAMSULOSIN HCL 0.4 MG PO CAPS: 0.4000 mg | ORAL_CAPSULE | Freq: Every day | ORAL | 0 refills | Status: DC

## 2021-06-02 NOTE — ED Triage Notes (Signed)
Pt arrives via EMS for lower ab pain that started this am. Pt believes that pain may be related to a kidney stone. Pt reports voiding normally all day until the last hour. Pt reports urgency over the last hour but little urine production. Pt reports normal BM yesterday but none today.

## 2021-06-02 NOTE — ED Notes (Signed)
  Foley catheter converted to a leg bag.  Patient instructed on how to adjust and empty bag.  Patient expressed understanding of teaching.

## 2021-06-02 NOTE — ED Notes (Signed)
  Patient bladder scanned at bedside.  Scan showed >500 ml of urine.  Dr Dalene Seltzer notified and given verbal order for foley.

## 2021-06-02 NOTE — ED Provider Notes (Signed)
[facility] EMERGENCY DEPARTMENT Provider Note   CSN: [id number] Arrival date & time: 06/02/21  1854     History Chief Complaint  Patient presents with   Abdominal Pain   Hypertension    Caleb Hines is a 65 y.o. male.  HPI     65yo male with history of hypertension, nephrolithiasis who presents with concern for abdominal pain.  Reports initially urinary dribbling this AM and then was unable to urinate since early afternoon.  Has had some constipation, did have normal BM yesterday. Abdominal pain is lower abdomen, severe, nothing makes it better or worse. No back pain, no nausea, vomiting. Canpt sit still.  No dysuria prior to this or hematuria. No new medication changes.  No fevers or chills. Had prior urinary retention once before.   Past Medical History:  Diagnosis Date   Hypertension    Kidney stones    Sinusitis     There are no problems to display for this patient.   No past surgical history on file.     No family history on file.  Social History   Tobacco Use   Smoking status: Never   Smokeless tobacco: Never  Substance Use Topics   Alcohol use: No   Drug use: No    Home Medications Prior to Admission medications   Medication Sig Start Date End Date Taking? Authorizing Provider  polyethylene glycol (MIRALAX) 17 g packet Take 17 g by mouth daily. 06/02/21  Yes Gareth Morgan, MD  tamsulosin (FLOMAX) 0.4 MG CAPS capsule Take 1 capsule (0.4 mg total) by mouth daily. 06/02/21  Yes Gareth Morgan, MD  amoxicillin (AMOXIL) 500 MG capsule Take 1 capsule (500 mg total) by mouth 3 (three) times daily. 10/28/15   Orpah Greek, MD  Ascorbic Acid (VITAMIN C PO) Take 2 tablets by mouth daily.    [provider]  atenolol (TENORMIN) 50 MG tablet Take 50 mg by mouth daily.      [provider]  atorvastatin (LIPITOR) 10 MG tablet Take 10 mg by mouth daily.    [provider]  benzonatate (TESSALON) 100 MG  capsule Take 1 capsule (100 mg total) by mouth every 8 (eight) hours. 10/28/15   Orpah Greek, MD  fluticasone (FLONASE) 50 MCG/ACT nasal spray Place 1 spray into both nostrils daily. 10/28/15   Orpah Greek, MD  HYDRALAZINE-HCTZ PO Take by mouth.    [provider]  hydrochlorothiazide (HYDRODIURIL) 25 MG tablet Take 25 mg by mouth daily.    [provider]  MAGNESIUM OXIDE PO Take 1 tablet by mouth daily.    [provider]  METOPROLOL SUCCINATE ER PO Take by mouth.    [provider]  Multiple Vitamin (MULITIVITAMIN WITH MINERALS) TABS Take 1 tablet by mouth daily.    [provider]  Omega-3 Fatty Acids (FISH OIL PO) Take 1 capsule by mouth daily.    [provider]    Allergies    Dairycare [lactase-lactobacillus], Milk-related compounds, Soy allergy, and Wheat bran  Review of Systems   Review of Systems  Constitutional:  Negative for fever.  Respiratory:  Negative for cough.   Gastrointestinal:  Positive for abdominal pain and constipation. Negative for diarrhea, nausea and vomiting.  Genitourinary:  Positive for difficulty urinating and frequency. Negative for dysuria and hematuria.  Musculoskeletal:  Negative for back pain.  Skin:  Negative for rash.  Neurological:  Negative for headaches.   Physical Exam Updated Vital Signs BP Marland Kitchen)  191/100 (BP Location: Right Arm)   Pulse 77   Temp 98.1 F (36.7 C) (Oral)   Resp 18   Ht 6\' 2"  (1.88 m)   Wt 91.2 kg   SpO2 99%   BMI 25.81 kg/m   Physical Exam Vitals (tenderness after foley catheter placement) and nursing note reviewed.  Constitutional:      General: He is not in acute distress.    Appearance: He is well-developed. He is not diaphoretic.  HENT:     Head: Normocephalic and atraumatic.  Eyes:     Conjunctiva/sclera: Conjunctivae normal.  Cardiovascular:     Rate and Rhythm: Normal rate and regular rhythm.     Heart sounds: Normal heart sounds. No  murmur heard.   No friction rub. No gallop.  Pulmonary:     Effort: Pulmonary effort is normal. No respiratory distress.     Breath sounds: Normal breath sounds. No wheezing or rales.  Abdominal:     General: There is no distension.     Palpations: Abdomen is soft.     Tenderness: There is no abdominal tenderness. There is no right CVA tenderness, left CVA tenderness or guarding.  Musculoskeletal:     Cervical back: Normal range of motion.  Skin:    General: Skin is warm and dry.  Neurological:     Mental Status: He is alert and oriented to person, place, and time.    ED Results / Procedures / Treatments   Labs (all labs ordered are listed, but only abnormal results are displayed) Labs Reviewed  BASIC METABOLIC PANEL - Abnormal; Notable for the following components:      Result Value   Sodium 131 (*)    Chloride 96 (*)    Glucose, Bld 125 (*)    BUN 7 (*)    All other components within normal limits  URINE CULTURE  URINALYSIS, ROUTINE W REFLEX MICROSCOPIC  CBC    EKG None  Radiology No results found.  Procedures Procedures   Medications Ordered in ED Medications  labetalol (NORMODYNE) injection 10 mg (10 mg Intravenous Given 06/02/21 2008)  morphine 4 MG/ML injection 4 mg (4 mg Intravenous Given 06/02/21 2007)    ED Course  I have reviewed the triage vital signs and the nursing notes.  Pertinent labs & imaging results that were available during my care of the patient were reviewed by me and considered in my medical decision making (see chart for details).    MDM Rules/Calculators/A&P                             65yo male with history of hypertension, nephrolithiasis who presents with concern for abdominal pain and difficulty urinating.  Found to have urinary retention. Foley catheter placed with about 1L urine output.  Symptoms improved after catheter placement and pain likely secondary to urinary obstruction. No UTI. Unclear trigger, does report  constipation. Prostate exam deferred as do not feel that will change acute care, no fever, doubt prostatitis without pain other than from retention. Will dc with foley, flomax, miralax for constipation, follow up with Urology for further evaluation.  BP elevated on arrival likely secondary to pain, no signs of BP emergency, recommend continued follow up with PCP regarding BP.    Final Clinical Impression(s) / ED Diagnoses Final diagnoses:  Urinary retention  Lower abdominal pain  Constipation, unspecified constipation type    Rx / DC Orders ED Discharge Orders  Ordered    tamsulosin (FLOMAX) 0.4 MG CAPS capsule  Daily        06/02/21 2233    polyethylene glycol (MIRALAX) 17 g packet  Daily        06/02/21 2235             Alvira Monday, MD 06/03/21 1351

## 2021-06-03 LAB — URINE CULTURE: Culture: NO GROWTH

## 2021-06-06 ENCOUNTER — Emergency Department (HOSPITAL_COMMUNITY)
Admission: EM | Admit: 2021-06-06 | Discharge: 2021-06-06 | Disposition: A | Discharge status: Home / Self Care | Payer: Medicare Other | Attending: Emergency Medicine | Admitting: Emergency Medicine

## 2021-06-06 ENCOUNTER — Other Ambulatory Visit: Payer: Self-pay

## 2021-06-06 ENCOUNTER — Emergency Department
Admission: EM | Admit: 2021-06-06 | Discharge: 2021-06-06 | Disposition: A | Discharge status: Home / Self Care | Payer: Medicare Other | Attending: Emergency Medicine | Admitting: Emergency Medicine

## 2021-06-06 DIAGNOSIS — Z5321 Procedure and treatment not carried out due to patient leaving prior to being seen by health care provider: Secondary | ICD-10-CM | POA: Diagnosis not present

## 2021-06-06 DIAGNOSIS — R339 Retention of urine, unspecified: Secondary | ICD-10-CM | POA: Insufficient documentation

## 2021-06-06 DIAGNOSIS — R319 Hematuria, unspecified: Secondary | ICD-10-CM | POA: Diagnosis present

## 2021-06-06 DIAGNOSIS — I10 Essential (primary) hypertension: Secondary | ICD-10-CM | POA: Insufficient documentation

## 2021-06-06 DIAGNOSIS — T83091A Other mechanical complication of indwelling urethral catheter, initial encounter: Secondary | ICD-10-CM | POA: Diagnosis not present

## 2021-06-06 DIAGNOSIS — Z79899 Other long term (current) drug therapy: Secondary | ICD-10-CM | POA: Diagnosis not present

## 2021-06-06 DIAGNOSIS — T839XXA Unspecified complication of genitourinary prosthetic device, implant and graft, initial encounter: Secondary | ICD-10-CM

## 2021-06-06 LAB — URINALYSIS, COMPLETE (UACMP) WITH MICROSCOPIC
RBC / HPF: >50 RBC/hpf — ABNORMAL HIGH (ref 0–5)
Specific Gravity, Urine: 1.022 (ref 1.005–1.030)
Squamous Epithelial / HPF: NONE SEEN (ref 0–5)
WBC, UA: >50 WBC/hpf — ABNORMAL HIGH (ref 0–5)

## 2021-06-06 LAB — URINALYSIS, ROUTINE W REFLEX MICROSCOPIC

## 2021-06-06 LAB — URINALYSIS, MICROSCOPIC (REFLEX): RBC / HPF: >50 RBC/hpf (ref 0–5)

## 2021-06-06 MED ORDER — HYDROCODONE-ACETAMINOPHEN 5-325 MG PO TABS
1.0000 | ORAL_TABLET | Freq: Once | ORAL | Status: AC
  Administered 2021-06-06: 1 via ORAL
  Filled 2021-06-06: qty 1

## 2021-06-06 MED ORDER — CIPROFLOXACIN HCL 500 MG PO TABS
500.0000 mg | ORAL_TABLET | Freq: Once | ORAL | Status: AC
  Administered 2021-06-06: 500 mg via ORAL
  Filled 2021-06-06: qty 1

## 2021-06-06 MED ORDER — CIPROFLOXACIN HCL 500 MG PO TABS: 500.0000 mg | ORAL_TABLET | Freq: Two times a day (BID) | ORAL | 0 refills | Status: DC

## 2021-06-06 MED ORDER — ONDANSETRON 4 MG PO TBDP
4.0000 mg | ORAL_TABLET | Freq: Once | ORAL | Status: AC
  Administered 2021-06-06: 4 mg via ORAL
  Filled 2021-06-06: qty 1

## 2021-06-06 MED ORDER — ONDANSETRON 4 MG PO TBDP
4.0000 mg | ORAL_TABLET | Freq: Three times a day (TID) | ORAL | 0 refills | Status: AC | PRN
Start: 2021-06-06 — End: 2021-06-11

## 2021-06-06 MED ORDER — CIPROFLOXACIN HCL 500 MG PO TABS: 500.0000 mg | ORAL_TABLET | Freq: Two times a day (BID) | ORAL | 0 refills | Status: AC

## 2021-06-06 MED ORDER — HYDROCODONE-ACETAMINOPHEN 5-325 MG PO TABS
1.0000 | ORAL_TABLET | Freq: Four times a day (QID) | ORAL | 0 refills | Status: AC | PRN
Start: 2021-06-06 — End: 2021-06-09

## 2021-06-06 NOTE — ED Provider Notes (Signed)
Emergency Medicine Provider Triage Evaluation Note  Caleb Hines , a 65 y.o. male  was evaluated in triage.  Pt complains of penile pain, hematuria.  Patient states that he had difficulty to urinating, had an indwelling catheter put in.  This morning he was leaking around the catheter, decided to take it out himself.  Patient pulled it out without removing the bulb and has since had penile pain and hematuria.  Patient is able to urinate at this time  Review of Systems  Positive: Penile pain, hematuria after self removing his indwelling catheter Negative: Abdominal pain, flank pain  Physical Exam  BP (!) 151/89 (BP Location: Left Arm)   Pulse 83   Temp 98.8 F (37.1 C) (Oral)   Resp 18   Ht 6\' 2"  (1.88 m)   Wt 95.3 kg   SpO2 100%   BMI 26.96 kg/m  Gen:   Awake, no distress   Resp:  Normal effort  MSK:   Moves extremities without difficulty  Other:    Medical Decision Making  Medically screening exam initiated at 4:19 PM.  Appropriate orders placed.  Sundance Moise was informed that the remainder of the evaluation will be completed by another provider, this initial triage assessment does not replace that evaluation, and the importance of remaining in the ED until their evaluation is complete.  Patient presents with penile pain and hematuria after removing his catheter at home.  Patient states he was leaking around the catheter and decided to take it out at home. Balloon remained inflated.  Patient is urinating on his own at this time.   Sarita Bottom, PA-C 06/06/21 1621    06/08/21, MD 06/06/21 [phone]

## 2021-06-06 NOTE — ED Triage Notes (Signed)
Pt reports that at 0730 he was leaking around his catheter so he googled how to take the catheter out and pulled it out without deflating the balloon, pt reports passing blood and thinks some tissue since

## 2021-06-06 NOTE — ED Triage Notes (Signed)
EMS stated, I accidentally pulled out my catheter this morning, I had urinary retention

## 2021-06-06 NOTE — ED Notes (Signed)
Patient decided to leave he stated he been waiting to long

## 2021-06-06 NOTE — Discharge Instructions (Signed)
Keep appointment with Urology. Take Cipro twice daily for ten days.

## 2021-06-06 NOTE — ED Provider Notes (Signed)
[facility]-EMERGENCY DEPARTMENT  ____________________________________________  Time seen: Approximately 6:19 PM  I have reviewed the triage vital signs and the nursing notes.   HISTORY  Chief Complaint urinary catheter pulled out   Historian Patient     HPI Caleb Hines is a 65 y.o. male presents to the emergency department with hematuria.  Patient states that he had leaking around his indwelling catheter and attempted to remove it himself.  Patient states that he panicked while attempting to remove it and pulled it out abruptly.  Patient states that he has noticed some hematuria since removal.  Denies dysuria, low back pain or fever.  States that he has a follow-up appointment with urology tomorrow.   Past Medical History:  Diagnosis Date   Hypertension    Kidney stones    Sinusitis      Immunizations up to date:  Yes.     Past Medical History:  Diagnosis Date   Hypertension    Kidney stones    Sinusitis     There are no problems to display for this patient.   No past surgical history on file.  Prior to Admission medications   Medication Sig Start Date End Date Taking? Authorizing Provider  HYDROcodone-acetaminophen (NORCO) 5-325 MG tablet Take 1 tablet by mouth every 6 (six) hours as needed for up to 3 days. 06/06/21 06/09/21 Yes Vallarie Mare M, PA-C  ondansetron (ZOFRAN ODT) 4 MG disintegrating tablet Take 1 tablet (4 mg total) by mouth every 8 (eight) hours as needed for up to 5 days. 06/06/21 06/11/21 Yes Vallarie Mare M, PA-C  amoxicillin (AMOXIL) 500 MG capsule Take 1 capsule (500 mg total) by mouth 3 (three) times daily. 10/28/15   Orpah Greek, MD  Ascorbic Acid (VITAMIN C PO) Take 2 tablets by mouth daily.    [provider]  atenolol (TENORMIN) 50 MG tablet Take 50 mg by mouth daily.      [provider]  atorvastatin (LIPITOR) 10 MG tablet Take 10 mg by mouth daily.    [provider]  benzonatate (TESSALON) 100 MG  capsule Take 1 capsule (100 mg total) by mouth every 8 (eight) hours. 10/28/15   Orpah Greek, MD  ciprofloxacin (CIPRO) 500 MG tablet Take 1 tablet (500 mg total) by mouth 2 (two) times daily for 10 days. 06/06/21 06/16/21  Lannie Fields, PA-C  fluticasone (FLONASE) 50 MCG/ACT nasal spray Place 1 spray into both nostrils daily. 10/28/15   Orpah Greek, MD  HYDRALAZINE-HCTZ PO Take by mouth.    [provider]  hydrochlorothiazide (HYDRODIURIL) 25 MG tablet Take 25 mg by mouth daily.    [provider]  MAGNESIUM OXIDE PO Take 1 tablet by mouth daily.    [provider]  METOPROLOL SUCCINATE ER PO Take by mouth.    [provider]  Multiple Vitamin (MULITIVITAMIN WITH MINERALS) TABS Take 1 tablet by mouth daily.    [provider]  Omega-3 Fatty Acids (FISH OIL PO) Take 1 capsule by mouth daily.    [provider]  polyethylene glycol (MIRALAX) 17 g packet Take 17 g by mouth daily. 06/02/21   Gareth Morgan, MD  tamsulosin (FLOMAX) 0.4 MG CAPS capsule Take 1 capsule (0.4 mg total) by mouth daily. 06/02/21   Gareth Morgan, MD    Allergies Dairycare [lactase-lactobacillus], Milk-related compounds, Soy allergy, and Wheat bran  No family history on file.  Social History Social History   Tobacco Use   Smoking status: Never  Smokeless tobacco: Never  Substance Use Topics   Alcohol use: No   Drug use: No     Review of Systems  Constitutional: No fever/chills Eyes:  No discharge ENT: No upper respiratory complaints. Respiratory: no cough. No SOB/ use of accessory muscles to breath Gastrointestinal:   No nausea, no vomiting.  No diarrhea.  No constipation. Genitourinary: Patient has hematuria.  Musculoskeletal: Negative for musculoskeletal pain. Skin: Negative for rash, abrasions, lacerations, ecchymosis.    ____________________________________________   PHYSICAL EXAM:  VITAL SIGNS: ED Triage Vitals   Enc Vitals Group     BP 06/06/21 1356 (!) 151/89     Pulse Rate 06/06/21 1356 83     Resp 06/06/21 1356 18     Temp 06/06/21 1356 98.8 F (37.1 C)     Temp Source 06/06/21 1356 Oral     SpO2 06/06/21 1356 100 %     Weight 06/06/21 1357 210 lb (95.3 kg)     Height 06/06/21 1357 6\' 2"  (1.88 m)     Head Circumference --      Peak Flow --      Pain Score 06/06/21 1356 5     Pain Loc --      Pain Edu? --      Excl. in GC? --      Constitutional: Alert and oriented. Well appearing and in no acute distress. Eyes: Conjunctivae are normal. PERRL. EOMI. Head: Atraumatic. ENT:      Nose: No congestion/rhinnorhea.      Mouth/Throat: Mucous membranes are moist.  Neck: No stridor.  No cervical spine tenderness to palpation. Cardiovascular: Normal rate, regular rhythm. Normal S1 and S2.  Good peripheral circulation. Respiratory: Normal respiratory effort without tachypnea or retractions. Lungs CTAB. Good air entry to the bases with no decreased or absent breath sounds Gastrointestinal: Bowel sounds x 4 quadrants. Soft and nontender to palpation. No guarding or rigidity. No distention. Musculoskeletal: Full range of motion to all extremities. No obvious deformities noted Neurologic:  Normal for age. No gross focal neurologic deficits are appreciated.  Skin:  Skin is warm, dry and intact. No rash noted. Psychiatric: Mood and affect are normal for age. Speech and behavior are normal.   ____________________________________________   LABS (all labs ordered are listed, but only abnormal results are displayed)  Labs Reviewed  URINALYSIS, COMPLETE (UACMP) WITH MICROSCOPIC - Abnormal; Notable for the following components:      Result Value   Color, Urine RED (*)    APPearance TURBID (*)    Glucose, UA   (*)    Value: TEST NOT REPORTED DUE TO COLOR INTERFERENCE OF URINE PIGMENT   Hgb urine dipstick   (*)    Value: TEST NOT REPORTED DUE TO COLOR INTERFERENCE OF URINE PIGMENT   Bilirubin Urine    (*)    Value: TEST NOT REPORTED DUE TO COLOR INTERFERENCE OF URINE PIGMENT   Ketones, ur   (*)    Value: TEST NOT REPORTED DUE TO COLOR INTERFERENCE OF URINE PIGMENT   Protein, ur   (*)    Value: TEST NOT REPORTED DUE TO COLOR INTERFERENCE OF URINE PIGMENT   Nitrite   (*)    Value: TEST NOT REPORTED DUE TO COLOR INTERFERENCE OF URINE PIGMENT   Leukocytes,Ua   (*)    Value: TEST NOT REPORTED DUE TO COLOR INTERFERENCE OF URINE PIGMENT   RBC / HPF >50 (*)    WBC, UA >50 (*)    Bacteria, UA FEW (*)  All other components within normal limits   ____________________________________________  EKG   ____________________________________________  RADIOLOGY   No results found.  ____________________________________________    PROCEDURES  Procedure(s) performed:     Procedures     Medications  ciprofloxacin (CIPRO) tablet 500 mg (has no administration in time range)  HYDROcodone-acetaminophen (NORCO/VICODIN) 5-325 MG per tablet 1 tablet (has no administration in time range)  ondansetron (ZOFRAN-ODT) disintegrating tablet 4 mg (has no administration in time range)     ____________________________________________   INITIAL IMPRESSION / ASSESSMENT AND PLAN / ED COURSE  Pertinent labs & imaging results that were available during my care of the patient were reviewed by me and considered in my medical decision making (see chart for details).       Assessment and plan Problem with Foley catheter 65 year old male presents to the emergency department to have Foley catheter placed.  Urinalysis revealed gross hematuria but also greater than 50 white blood cells and rare bacteria.  I started patient on ciprofloxacin until he can be seen by urology.  He was prescribed a short course of Norco for pain and given his first dose of Norco and Cipro while in the emergency department.    ____________________________________________  FINAL CLINICAL IMPRESSION(S) / ED  DIAGNOSES  Final diagnoses:  Problem with Foley catheter, initial encounter ([place])      NEW MEDICATIONS STARTED DURING THIS VISIT:  ED Discharge Orders          Ordered    ciprofloxacin (CIPRO) 500 MG tablet  2 times daily,   Status:  Discontinued        06/06/21 1814    ciprofloxacin (CIPRO) 500 MG tablet  2 times daily        06/06/21 1815    HYDROcodone-acetaminophen (NORCO) 5-325 MG tablet  Every 6 hours PRN        06/06/21 1817    ondansetron (ZOFRAN ODT) 4 MG disintegrating tablet  Every 8 hours PRN        06/06/21 1817                This chart was dictated using voice recognition software/Dragon. Despite best efforts to proofread, errors can occur which can change the meaning. Any change was purely unintentional.     Lannie Fields, PA-C 06/06/21 Ladoris Gene    Duffy Bruce, MD 06/06/21 2027

## 2022-11-11 ENCOUNTER — Encounter (HOSPITAL_BASED_OUTPATIENT_CLINIC_OR_DEPARTMENT_OTHER): Payer: Self-pay | Admitting: Emergency Medicine

## 2022-11-11 ENCOUNTER — Emergency Department (HOSPITAL_BASED_OUTPATIENT_CLINIC_OR_DEPARTMENT_OTHER)
Admission: EM | Admit: 2022-11-11 | Discharge: 2022-11-11 | Disposition: A | Discharge status: Home / Self Care | Payer: Medicare Other | Attending: Emergency Medicine | Admitting: Emergency Medicine

## 2022-11-11 ENCOUNTER — Other Ambulatory Visit: Payer: Self-pay

## 2022-11-11 DIAGNOSIS — I10 Essential (primary) hypertension: Secondary | ICD-10-CM | POA: Insufficient documentation

## 2022-11-11 DIAGNOSIS — H9311 Tinnitus, right ear: Secondary | ICD-10-CM | POA: Diagnosis not present

## 2022-11-11 DIAGNOSIS — H9201 Otalgia, right ear: Secondary | ICD-10-CM

## 2022-11-11 MED ORDER — ACETAMINOPHEN 325 MG PO TABS
650.0000 mg | ORAL_TABLET | Freq: Once | ORAL | Status: AC
  Administered 2022-11-11: 650 mg via ORAL
  Filled 2022-11-11: qty 2

## 2022-11-11 NOTE — ED Triage Notes (Signed)
Patient arrived via POV c/o otalgia x 2 weeks, worsening over last 2 days. Patient states "loudness" w/ pain in right ear. Patient states 3/10 pain. Patient is AO x 4, VS w/ elevated BP, normal gait.

## 2022-11-11 NOTE — ED Provider Notes (Signed)
[place] [facility] Provider Note   CSN: [id number] Arrival date & time: 11/11/22  0106     History  Chief Complaint  Patient presents with   Otalgia    Caleb Hines is a 67 y.o. male.  The history is provided by the patient.  Otalgia Location:  Right Behind ear:  No abnormality Quality: loudness in harsh sounds. Onset quality:  Gradual Duration:  2 weeks Timing:  Constant Progression:  Improving Chronicity:  New Context: not direct blow   Context comment:  Qtip in ear Relieved by:  Nothing Worsened by:  Nothing Associated symptoms: tinnitus   Associated symptoms: no cough, no fever and no hearing loss   Risk factors: no recent travel       Past Medical History:  Diagnosis Date   Hypertension    Kidney stones    Sinusitis     Home Medications Prior to Admission medications   Medication Sig Start Date End Date Taking? Authorizing Provider  amoxicillin (AMOXIL) 500 MG capsule Take 1 capsule (500 mg total) by mouth 3 (three) times daily. 10/28/15   Gilda Crease, MD  Ascorbic Acid (VITAMIN C PO) Take 2 tablets by mouth daily.    [provider]  atenolol (TENORMIN) 50 MG tablet Take 50 mg by mouth daily.      [provider]  atorvastatin (LIPITOR) 10 MG tablet Take 10 mg by mouth daily.    [provider]  benzonatate (TESSALON) 100 MG capsule Take 1 capsule (100 mg total) by mouth every 8 (eight) hours. 10/28/15   Gilda Crease, MD  fluticasone (FLONASE) 50 MCG/ACT nasal spray Place 1 spray into both nostrils daily. 10/28/15   Gilda Crease, MD  HYDRALAZINE-HCTZ PO Take by mouth.    [provider]  hydrochlorothiazide (HYDRODIURIL) 25 MG tablet Take 25 mg by mouth daily.    [provider]  MAGNESIUM OXIDE PO Take 1 tablet by mouth daily.    [provider]  METOPROLOL SUCCINATE ER PO Take by mouth.    [provider]  Multiple Vitamin  (MULITIVITAMIN WITH MINERALS) TABS Take 1 tablet by mouth daily.    [provider]  Omega-3 Fatty Acids (FISH OIL PO) Take 1 capsule by mouth daily.    [provider]  polyethylene glycol (MIRALAX) 17 g packet Take 17 g by mouth daily. 06/02/21   Alvira Monday, MD  tamsulosin (FLOMAX) 0.4 MG CAPS capsule Take 1 capsule (0.4 mg total) by mouth daily. 06/02/21   Alvira Monday, MD      Allergies    Amlodipine, Amoxicillin-pot clavulanate, Dairycare [lactase-lactobacillus], Milk-related compounds, Soy allergy, and Wheat    Review of Systems   Review of Systems  Constitutional:  Negative for fever.  HENT:  Positive for ear pain and tinnitus. Negative for hearing loss and sinus pain.   Respiratory:  Negative for cough.   All other systems reviewed and are negative.   Physical Exam Updated Vital Signs BP (!) 180/89 (BP Location: Left Arm)   Pulse 63   Temp 98.1 F (36.7 C) (Oral)   Resp 16   Ht  (1.88 m)   Wt 93 kg   SpO2 99%   BMI 26.32 kg/m  Physical Exam Vitals and nursing note reviewed.  Constitutional:      General: He is not in acute distress.    Appearance: He is well-developed. He is not diaphoretic.  HENT:     Head:  Normocephalic and atraumatic.     Comments: No pain or swelling over the mastoids, auricles in normal positiion     Right Ear: Tympanic membrane and external ear normal.     Left Ear: Tympanic membrane, ear canal and external ear normal.     Nose: Nose normal.     Comments: No fluid in the sinuses on transillumination     Mouth/Throat:     Mouth: Mucous membranes are moist.     Pharynx: Oropharynx is clear.  Eyes:     Conjunctiva/sclera: Conjunctivae normal.     Pupils: Pupils are equal, round, and reactive to light.  Cardiovascular:     Rate and Rhythm: Normal rate and regular rhythm.  Pulmonary:     Effort: Pulmonary effort is normal.     Breath sounds: Normal breath sounds. No wheezing or rales.  Abdominal:      General: Bowel sounds are normal.     Palpations: Abdomen is soft.     Tenderness: There is no abdominal tenderness. There is no guarding or rebound.  Musculoskeletal:        General: Normal range of motion.     Cervical back: Normal range of motion and neck supple.  Skin:    General: Skin is warm and dry.  Neurological:     Mental Status: He is alert and oriented to person, place, and time.     ED Results / Procedures / Treatments   Labs (all labs ordered are listed, but only abnormal results are displayed) Labs Reviewed - No data to display  EKG None  Radiology No results found.  Procedures Procedures    Medications Ordered in ED Medications  acetaminophen (TYLENOL) tablet 650 mg (has no administration in time range)    ED Course/ Medical Decision Making/ A&P                             Medical Decision Making Ear noise x 2 weeks better with sweet oil   Amount and/or Complexity of Data Reviewed External Data Reviewed: notes.    Details: Previous notes reviewed   Risk OTC drugs. Risk Details: Tinnitus in the ear, normal TM and canal, stable for discharge.  Follow up with ENT.      Final Clinical Impression(s) / ED Diagnoses Final diagnoses:  Tinnitus of right ear   Return for intractable cough, coughing up blood, fevers > 100.4 unrelieved by medication, shortness of breath, intractable vomiting, chest pain, shortness of breath, weakness, numbness, changes in speech, facial asymmetry, abdominal pain, passing out, Inability to tolerate liquids or food, cough, altered mental status or any concerns. No signs of systemic illness or infection. The patient is nontoxic-appearing on exam and vital signs are within normal limits.  I have reviewed the triage vital signs and the nursing notes. Pertinent labs & imaging results that were available during my care of the patient were reviewed by me and considered in my medical decision making (see chart for details). After  history, exam, and medical workup I feel the patient has been appropriately medically screened and is safe for discharge home. Pertinent diagnoses were discussed with the patient. Patient was given return precautions.    Rx / DC Orders ED Discharge Orders     None         Toccara Alford, MD 11/11/22 [phone]

## 2022-12-19 ENCOUNTER — Other Ambulatory Visit: Payer: Self-pay

## 2022-12-19 ENCOUNTER — Encounter (HOSPITAL_BASED_OUTPATIENT_CLINIC_OR_DEPARTMENT_OTHER): Payer: Self-pay | Admitting: Emergency Medicine

## 2022-12-19 ENCOUNTER — Emergency Department (HOSPITAL_BASED_OUTPATIENT_CLINIC_OR_DEPARTMENT_OTHER)
Admission: EM | Admit: 2022-12-19 | Discharge: 2022-12-19 | Disposition: A | Discharge status: Home / Self Care | Payer: Medicare Other | Attending: Emergency Medicine | Admitting: Emergency Medicine

## 2022-12-19 DIAGNOSIS — Y732 Prosthetic and other implants, materials and accessory gastroenterology and urology devices associated with adverse incidents: Secondary | ICD-10-CM | POA: Insufficient documentation

## 2022-12-19 DIAGNOSIS — N3000 Acute cystitis without hematuria: Secondary | ICD-10-CM | POA: Diagnosis not present

## 2022-12-19 DIAGNOSIS — R1030 Lower abdominal pain, unspecified: Secondary | ICD-10-CM | POA: Diagnosis present

## 2022-12-19 DIAGNOSIS — T83098A Other mechanical complication of other indwelling urethral catheter, initial encounter: Secondary | ICD-10-CM | POA: Insufficient documentation

## 2022-12-19 DIAGNOSIS — T839XXA Unspecified complication of genitourinary prosthetic device, implant and graft, initial encounter: Secondary | ICD-10-CM

## 2022-12-19 LAB — CBC
HCT: 39.5 % (ref 39.0–52.0)
Hemoglobin: 13.7 g/dL (ref 13.0–17.0)
MCH: 30 pg (ref 26.0–34.0)
MCHC: 34.7 g/dL (ref 30.0–36.0)
MCV: 86.4 fL (ref 80.0–100.0)
Platelets: 231 10*3/uL (ref 150–400)
RBC: 4.57 MIL/uL (ref 4.22–5.81)
RDW: 14.1 % (ref 11.5–15.5)
WBC: 5.7 10*3/uL (ref 4.0–10.5)
nRBC: 0 % (ref 0.0–0.2)

## 2022-12-19 LAB — URINALYSIS, W/ REFLEX TO CULTURE (INFECTION SUSPECTED)
Bilirubin Urine: NEGATIVE
Glucose, UA: NEGATIVE mg/dL
Ketones, ur: NEGATIVE mg/dL
Nitrite: POSITIVE — AB
Protein, ur: NEGATIVE mg/dL
Specific Gravity, Urine: 1.012 (ref 1.005–1.030)
pH: 6 (ref 5.0–8.0)

## 2022-12-19 LAB — BASIC METABOLIC PANEL
Anion gap: 16 — ABNORMAL HIGH (ref 5–15)
BUN: 15 mg/dL (ref 8–23)
CO2: 20 mmol/L — ABNORMAL LOW (ref 22–32)
Calcium: 9.7 mg/dL (ref 8.9–10.3)
Chloride: 99 mmol/L (ref 98–111)
Creatinine, Ser: 0.92 mg/dL (ref 0.61–1.24)
GFR, Estimated: >60 mL/min (ref >60)
Glucose, Bld: 120 mg/dL — ABNORMAL HIGH (ref 70–99)
Potassium: 3.3 mmol/L — ABNORMAL LOW (ref 3.5–5.1)
Sodium: 135 mmol/L (ref 135–145)

## 2022-12-19 MED ORDER — CEPHALEXIN 500 MG PO CAPS: 500.0000 mg | ORAL_CAPSULE | Freq: Two times a day (BID) | ORAL | 0 refills | Status: AC

## 2022-12-19 NOTE — ED Provider Notes (Signed)
[place] [facility] DE[facility] AT [facility] Provider Note   CSN: [id number] Arrival date & time: 12/19/22  1046     History  Chief Complaint  Patient presents with   Abdominal Pain    Caleb Hines is a 67 y.o. male.   Abdominal Pain    Patient presents to the ED for evaluation of suprapubic pain associated with a Foley catheter that is not draining.  Patient states he started pain about an hour ago.  His catheter has not been draining today.  He even unhooked it from the bag and there is no drainage whatsoever.  He feels like his bladder is distended.  No fevers no vomiting.  Home Medications Prior to Admission medications   Medication Sig Start Date End Date Taking? Authorizing Provider  cephALEXin (KEFLEX) 500 MG capsule Take 1 capsule (500 mg total) by mouth 2 (two) times daily for 7 days. 12/19/22 12/26/22 Yes Linwood Dibbles, MD  Ascorbic Acid (VITAMIN C PO) Take 2 tablets by mouth daily.    [provider]  atenolol (TENORMIN) 50 MG tablet Take 50 mg by mouth daily.      [provider]  atorvastatin (LIPITOR) 10 MG tablet Take 10 mg by mouth daily.    [provider]  benzonatate (TESSALON) 100 MG capsule Take 1 capsule (100 mg total) by mouth every 8 (eight) hours. 10/28/15   Gilda Crease, MD  fluticasone (FLONASE) 50 MCG/ACT nasal spray Place 1 spray into both nostrils daily. 10/28/15   Gilda Crease, MD  HYDRALAZINE-HCTZ PO Take by mouth.    [provider]  hydrochlorothiazide (HYDRODIURIL) 25 MG tablet Take 25 mg by mouth daily.    [provider]  MAGNESIUM OXIDE PO Take 1 tablet by mouth daily.    [provider]  METOPROLOL SUCCINATE ER PO Take by mouth.    [provider]  Multiple Vitamin (MULITIVITAMIN WITH MINERALS) TABS Take 1 tablet by mouth daily.    [provider]  Omega-3 Fatty Acids (FISH OIL PO) Take 1 capsule by mouth daily.    [provider]   polyethylene glycol (MIRALAX) 17 g packet Take 17 g by mouth daily. 06/02/21   Alvira Monday, MD  tamsulosin (FLOMAX) 0.4 MG CAPS capsule Take 1 capsule (0.4 mg total) by mouth daily. 06/02/21   Alvira Monday, MD  telmisartan-hydrochlorothiazide (MICARDIS HCT) 40-12.5 MG tablet Take 1 tablet by mouth 2 (two) times daily.    [provider]      Allergies    Amlodipine, Amoxicillin-pot clavulanate, Dairycare [lactase-lactobacillus], Milk-related compounds, Soy allergy, and Wheat    Review of Systems   Review of Systems  Gastrointestinal:  Positive for abdominal pain.    Physical Exam Updated Vital Signs BP (!) 152/121 (BP Location: Left Arm)   Pulse 94   Temp 98.1 F (36.7 C)   Resp (!) 22   Ht 1.88 m (6\' 2" )   Wt 93 kg   SpO2 99%   BMI 26.32 kg/m  Physical Exam Vitals and nursing note reviewed.  Constitutional:      Appearance: He is well-developed.     Comments: Patient standing pacing  HENT:     Head: Normocephalic and atraumatic.     Right Ear: External ear normal.     Left Ear: External ear normal.  Eyes:     General: No scleral icterus.       Right eye: No discharge.        Left eye:  No discharge.     Conjunctiva/sclera: Conjunctivae normal.  Neck:     Trachea: No tracheal deviation.  Cardiovascular:     Rate and Rhythm: Normal rate.  Pulmonary:     Effort: Pulmonary effort is normal. No respiratory distress.     Breath sounds: No stridor.  Abdominal:     General: There is no distension.     Tenderness: There is abdominal tenderness in the suprapubic area.  Genitourinary:    Comments: Foley catheter in place, no urine in drainage from the catheter Musculoskeletal:        General: No swelling or deformity.     Cervical back: Neck supple.  Skin:    General: Skin is warm and dry.     Findings: No rash.  Neurological:     Mental Status: He is alert. Mental status is at baseline.     Cranial Nerves: No dysarthria or facial asymmetry.      Motor: No seizure activity.     ED Results / Procedures / Treatments   Labs (all labs ordered are listed, but only abnormal results are displayed) Labs Reviewed  URINALYSIS, W/ REFLEX TO CULTURE (INFECTION SUSPECTED) - Abnormal; Notable for the following components:      Result Value   Hgb urine dipstick LARGE (*)    Nitrite POSITIVE (*)    Leukocytes,Ua LARGE (*)    Bacteria, UA MANY (*)    All other components within normal limits  BASIC METABOLIC PANEL - Abnormal; Notable for the following components:   Potassium 3.3 (*)    CO2 20 (*)    Glucose, Bld 120 (*)    Anion gap 16 (*)    All other components within normal limits  URINE CULTURE  CBC    EKG None  Radiology No results found.  Procedures Procedures    Medications Ordered in ED Medications - No data to display  ED Course/ Medical Decision Making/ A&P Clinical Course as of 12/19/22 1224  Tue Dec 19, 2022  1136 Foley catheter was replaced.  Patient states he is feeling much better now [JK]  1159 Metabolic panel normal. [JK]    Clinical Course User Index [JK] Linwood Dibbles, MD                             Medical Decision Making Problems Addressed: Acute cystitis without hematuria: acute illness or injury that poses a threat to life or bodily functions Complication of Foley catheter, initial encounter [facility]): chronic illness or injury with exacerbation, progression, or side effects of treatment  Amount and/or Complexity of Data Reviewed Labs: ordered. Decision-making details documented in ED Course.  Risk Prescription drug management.   Patient presented to the ED for evaluation of discomfort in the suprapubic region.  Patient did not feel like his Foley catheter was draining properly.  Times reviewed.  No signs of acute renal failure.  Patient's catheter was replaced and he is now draining properly.  His urinalysis is also suggestive of urinary tract infection.  Will send off a culture and start him on  antibiotics.        Final Clinical Impression(s) / ED Diagnoses Final diagnoses:  Acute cystitis without hematuria  Complication of Foley catheter, initial encounter (HCC)    Rx / DC Orders ED Discharge Orders          Ordered    cephALEXin (KEFLEX) 500 MG capsule  2 times daily  12/19/22 1222              Linwood Dibbles, MD 12/19/22 1224

## 2022-12-19 NOTE — ED Triage Notes (Signed)
Pt arrives to ED with c/o suprapubic abdominal pain x1 hour. He has a chronic urinary catheter that he notes is not draining properly today.

## 2022-12-19 NOTE — Discharge Instructions (Signed)
Take the antibiotics as prescribed.  Return to the ER for trouble with fevers vomiting or other concerning symptoms.  Follow-up with your urologist as planned regarding your Foley catheter

## 2022-12-22 LAB — URINE CULTURE: Culture: >=100000 — AB

## 2022-12-23 ENCOUNTER — Telehealth (HOSPITAL_BASED_OUTPATIENT_CLINIC_OR_DEPARTMENT_OTHER): Payer: Self-pay | Admitting: *Deleted

## 2022-12-23 NOTE — Telephone Encounter (Signed)
Post ED Visit - Positive Culture Follow-up: Successful Patient Follow-Up  Culture assessed and recommendations reviewed by:  []  Enzo Bi, Pharm.D. []  Celedonio Miyamoto, Pharm.D., BCPS AQ-ID []  Garvin Fila, Pharm.D., BCPS []  Georgina Pillion, [address].D., BCPS []  [place], [place].D., BCPS, AAHIVP []  Estella Husk, Pharm.D., BCPS, AAHIVP []  Lysle Pearl, PharmD, BCPS []  Phillips Climes, PharmD, BCPS []  Agapito Games, PharmD, BCPS [x]  Delmar Landau,  PharmD  Positive urine culture  []  Patient discharged without antimicrobial prescription and treatment is now indicated [x]  Organism is resistant to prescribed ED discharge antimicrobial []  Patient with positive blood cultures  Changes discussed with ED provider: Tanda Rockers, DO New antibiotic prescription Macrobid 100mg  BID x 7 days Stop Keflex Called to [facility], [place], [place]  Contacted patient, date 12/23/22 , time 1257   Caleb Hines 12/23/2022, 12:58 PM

## 2022-12-23 NOTE — Progress Notes (Signed)
ED Antimicrobial Stewardship Positive Culture Follow Up   Caleb Hines is an 67 y.o. male who presented to [facility] on 12/19/2022 with a chief complaint of  Chief Complaint  Patient presents with   Abdominal Pain    Recent Results (from the past 720 hour(s))  Urine Culture     Status: Abnormal   Collection Time: 12/19/22 10:57 AM   Specimen: Urine, Random  Result Value Ref Range Status   Specimen Description   Final    URINE, RANDOM Performed at [facility], [address], [place], [place] [address]    Special Requests   Final    NONE Reflexed from [id number] Performed at [facility], [address], [place], [place] [address]    Culture (A)  Final    >=100,000 COLONIES/mL ESCHERICHIA COLI Confirmed Extended Spectrum Beta-Lactamase Producer (ESBL).  In bloodstream infections from ESBL organisms, carbapenems are preferred over piperacillin/tazobactam. They are shown to have a lower risk of mortality. Two isolates with different morphologies were identified as the same organism.The most resistant organism was reported. Performed at [facility], [address] [place], [place] [address]    Report Status 12/22/2022 FINAL  Final   Organism ID, Bacteria ESCHERICHIA COLI (A)  Final      Susceptibility   Escherichia coli - MIC*    AMPICILLIN >=32 RESISTANT Resistant     CEFAZOLIN >=64 RESISTANT Resistant     CEFEPIME 16 RESISTANT Resistant     CEFTRIAXONE >=64 RESISTANT Resistant     CIPROFLOXACIN >=4 RESISTANT Resistant     GENTAMICIN <=1 SENSITIVE Sensitive     IMIPENEM 1 SENSITIVE Sensitive     NITROFURANTOIN <=16 SENSITIVE Sensitive     TRIMETH/SULFA >=320 RESISTANT Resistant     AMPICILLIN/SULBACTAM 8 SENSITIVE Sensitive     PIP/TAZO <=4 SENSITIVE Sensitive     * >=100,000 COLONIES/mL ESCHERICHIA COLI    [x]  Treated with Keflex, organism resistant to prescribed antimicrobial  STOP Keflex START: Nitrofurantoin (Macrobid)  100mg  BID x 7 days (Qty 14; Refills 0)  ED Provider: Tanda Rockers, DO  Delmar Landau, PharmD, BCPS 12/23/2022 11:00 AM ED Clinical Pharmacist -  [phone]

## 2023-02-21 LAB — EXTERNAL GENERIC LAB PROCEDURE

## 2023-06-11 ENCOUNTER — Emergency Department (HOSPITAL_BASED_OUTPATIENT_CLINIC_OR_DEPARTMENT_OTHER)
Admission: EM | Admit: 2023-06-11 | Discharge: 2023-06-11 | Discharge status: Against Medical Advice | Payer: Medicare Other | Attending: Emergency Medicine | Admitting: Emergency Medicine

## 2023-06-11 ENCOUNTER — Other Ambulatory Visit: Payer: Self-pay

## 2023-06-11 DIAGNOSIS — R319 Hematuria, unspecified: Secondary | ICD-10-CM | POA: Diagnosis present

## 2023-06-11 DIAGNOSIS — Z5321 Procedure and treatment not carried out due to patient leaving prior to being seen by health care provider: Secondary | ICD-10-CM | POA: Insufficient documentation

## 2023-06-11 NOTE — ED Triage Notes (Signed)
Patient states he wants his catheter checked. C/o of burning around catheter. Also states he has not had it changed in "a long time"

## 2023-06-12 ENCOUNTER — Emergency Department (HOSPITAL_BASED_OUTPATIENT_CLINIC_OR_DEPARTMENT_OTHER)
Admission: EM | Admit: 2023-06-12 | Discharge: 2023-06-12 | Disposition: A | Discharge status: Home / Self Care | Payer: Medicare Other | Attending: Emergency Medicine | Admitting: Emergency Medicine

## 2023-06-12 ENCOUNTER — Encounter (HOSPITAL_BASED_OUTPATIENT_CLINIC_OR_DEPARTMENT_OTHER): Payer: Self-pay

## 2023-06-12 ENCOUNTER — Other Ambulatory Visit: Payer: Self-pay

## 2023-06-12 DIAGNOSIS — R300 Dysuria: Secondary | ICD-10-CM | POA: Insufficient documentation

## 2023-06-12 DIAGNOSIS — R319 Hematuria, unspecified: Secondary | ICD-10-CM | POA: Diagnosis not present

## 2023-06-12 LAB — URINALYSIS, ROUTINE W REFLEX MICROSCOPIC
Bilirubin Urine: NEGATIVE
Glucose, UA: NEGATIVE mg/dL
Ketones, ur: NEGATIVE mg/dL
Nitrite: NEGATIVE
Protein, ur: 30 mg/dL — AB
Specific Gravity, Urine: 1.006 (ref 1.005–1.030)
pH: 6 (ref 5.0–8.0)

## 2023-06-12 NOTE — ED Provider Notes (Signed)
[place] EMERGENCY DEPARTMENT AT [facility] Provider Note   CSN: [id number] Arrival date & time: 06/12/23  0744     History  Chief Complaint  Patient presents with   Dysuria    Caleb Hines is a 67 y.o. male presented to the ER dysuria hematuria.  The patient has chronic enlarged prostate, chronic indwelling Foley for 2 years.  He last had his exchange in May and emergency department.  He reports he has been to 2 urology clinics but has been unhappy with both, including [facility] and [facility].  He says that he "did not like the way they talk to me" at [facility] and did not appreciate the staff comments at [facility] urology.  He noted for the past several days he has been having dysuria and a burning sensation around his catheter with a small amount of bleeding into the bladder bag.  Denies fevers or chills.  Per my review of external records the patient had a urine culture performed in May during his last Foley exchange.  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  He was prescribed Macrobid twice daily by the pharmacist based on culture results and he completed the course of antibiotics.  HPI     Home Medications Prior to Admission medications   Medication Sig Start Date End Date Taking? Authorizing Provider  Ascorbic Acid (VITAMIN C PO) Take 2 tablets by mouth daily.    [provider]  atenolol (TENORMIN) 50 MG tablet Take 50 mg by mouth daily.      [provider]  atorvastatin (LIPITOR) 10 MG tablet Take 10 mg by mouth daily.    [provider]  benzonatate (TESSALON) 100 MG capsule Take 1 capsule (100 mg total) by mouth every 8 (eight) hours. 10/28/15   Gilda Crease, MD  fluticasone (FLONASE) 50 MCG/ACT nasal spray Place 1 spray into both nostrils daily. 10/28/15   Gilda Crease, MD  HYDRALAZINE-HCTZ PO Take by mouth.    [provider]  hydrochlorothiazide  (HYDRODIURIL) 25 MG tablet Take 25 mg by mouth daily.    [provider]  MAGNESIUM OXIDE PO Take 1 tablet by mouth daily.    [provider]  METOPROLOL SUCCINATE ER PO Take by mouth.    [provider]  Multiple Vitamin (MULITIVITAMIN WITH MINERALS) TABS Take 1 tablet by mouth daily.    [provider]  Omega-3 Fatty Acids (FISH OIL PO) Take 1 capsule by mouth daily.    [provider]  polyethylene glycol (MIRALAX) 17 g packet Take 17 g by mouth daily. 06/02/21   Alvira Monday, MD  tamsulosin (FLOMAX) 0.4 MG CAPS capsule Take 1 capsule (0.4 mg total) by mouth daily. 06/02/21   Alvira Monday, MD  telmisartan-hydrochlorothiazide (MICARDIS HCT) 40-12.5 MG tablet Take 1 tablet by mouth 2 (two) times daily.    [provider]      Allergies    Amlodipine, Amoxicillin-pot clavulanate, Dairycare [bacid], Milk-related compounds, Soy allergy, and Wheat    Review of Systems   Review of Systems  Physical Exam Updated Vital Signs BP (!) 179/98 (BP Location: Right Arm)   Pulse 65   Temp 98.5 F (36.9 C)   Resp 20   Ht 6\' 2"  (1.88 m)   Wt 93 kg   SpO2 99%   BMI 26.32 kg/m  Physical Exam Constitutional:      General: He is not in acute distress. HENT:  Head: Normocephalic and atraumatic.  Eyes:     Conjunctiva/sclera: Conjunctivae normal.     Pupils: Pupils are equal, round, and reactive to light.  Cardiovascular:     Rate and Rhythm: Normal rate and regular rhythm.  Pulmonary:     Effort: Pulmonary effort is normal. No respiratory distress.  Abdominal:     General: There is no distension.     Tenderness: There is no abdominal tenderness.  Genitourinary:    Comments: Indwelling foley yellow urine Skin:    General: Skin is warm and dry.  Neurological:     General: No focal deficit present.     Mental Status: He is alert. Mental status is at baseline.  Psychiatric:        Mood and Affect: Mood normal.         Behavior: Behavior normal.     ED Results / Procedures / Treatments   Labs (all labs ordered are listed, but only abnormal results are displayed) Labs Reviewed  URINALYSIS, ROUTINE W REFLEX MICROSCOPIC - Abnormal; Notable for the following components:      Result Value   Color, Urine COLORLESS (*)    Hgb urine dipstick LARGE (*)    Protein, ur 30 (*)    Leukocytes,Ua MODERATE (*)    All other components within normal limits  URINE CULTURE    EKG None  Radiology No results found.  Procedures Procedures    Medications Ordered in ED Medications - No data to display  ED Course/ Medical Decision Making/ A&P                                 Medical Decision Making Amount and/or Complexity of Data Reviewed Labs: ordered.   Patient is presenting for exchange of chronic indwelling Foley.  This Foley's been in place for 5 months.  Explained to the patient the dangers of leaving a Foley in place for that long, particularly given his risk of infection.  I made clear that the Foley should be exchanged every 4 weeks on average.  He understands the importance of needing to have this done in the urology office, particularly for a void trial which he has requested.  I can offer him [facility] again for follow up as our in system specialists, or he can search in a different hospital network, but he understands the emergency department is not a substitute for chronic urological issue management.  Given his report of hematuria and dysuria we will send a UA off of the new Foley catheter after it has been placed.  We will likely send a culture as well.    UA reviewed.  There are leukocytes and some blood but no nitrites, no clear evidence of infection.  I discussed with the patient and we will watch and wait on the urine culture result rather than initiating antibiotics at this time, given growing concern for developing resistance.  The patient is comfortable with this  plan.        Final Clinical Impression(s) / ED Diagnoses Final diagnoses:  Dysuria    Rx / DC Orders ED Discharge Orders     None         Terald Sleeper, MD 06/12/23 [phone]

## 2023-06-12 NOTE — Discharge Instructions (Signed)
It is very important that you have your Foley catheter exchanged every 4 weeks.  Keeping a catheter in longer can lead to serious infections.  This catheter can be exchanged at your doctor's office, but more preferably at the urology office.  You should see a urology specialist for your ongoing issues with urinating.  Please schedule an appointment to follow-up.  We sent a urine culture today.  If you are growing bacteria that looks like a real infection, you will receive a phone call prescribing of antibiotics.  We decided not to begin antibiotics at this time as there were not convincing signs of infection on your urinalysis in the ER.

## 2023-06-12 NOTE — ED Triage Notes (Signed)
Pt POV from home states he thinks his catheter has been in for too long, stating since May or June. C/o burning around catheter, states a small amount of blood in drainage bag.

## 2023-06-14 LAB — URINE CULTURE
Culture: 10000 — AB
Special Requests: NORMAL

## 2023-06-15 ENCOUNTER — Telehealth (HOSPITAL_BASED_OUTPATIENT_CLINIC_OR_DEPARTMENT_OTHER): Payer: Self-pay | Admitting: *Deleted

## 2023-06-15 NOTE — Telephone Encounter (Signed)
Post ED Visit - Positive Culture Follow-up  Culture report reviewed by antimicrobial stewardship pharmacist: [facility] [x]  Lennie Muckle, Pharm.D. []  Celedonio Miyamoto, Pharm.D., BCPS AQ-ID []  Garvin Fila, Pharm.D., BCPS []  Georgina Pillion, Pharm.D., BCPS []  [place], [place].D., BCPS, AAHIVP []  Estella Husk, Pharm.D., BCPS, AAHIVP []  Lysle Pearl, PharmD, BCPS []  Phillips Climes, PharmD, BCPS []  Agapito Games, PharmD, BCPS []  Verlan Friends, PharmD []  Mervyn Gay, PharmD, BCPS []  Vinnie Level, PharmD  [facility] []  Len Childs, PharmD []  Greer Pickerel, PharmD []  Adalberto Cole, PharmD []  Perlie Gold, Rph []  Lonell Face) Jean Rosenthal, PharmD []  Earl Many, PharmD []  Junita Push, PharmD []  Dorna Leitz, PharmD []  Terrilee Files, PharmD []  Lynann Beaver, PharmD []  Keturah Barre, PharmD []  Loralee Pacas, PharmD []  Bernadene Person, PharmD   Positive urine culture OK not to treat and no further patient follow-up is required at this time. Glendora Score, MD  Virl [place] 06/15/2023, 11:17 AM

## 2023-06-15 NOTE — Progress Notes (Signed)
ED Antimicrobial Stewardship Positive Culture Follow Up   Caleb Hines is an 67 y.o. male who presented to [facility] on 06/12/2023 with a chief complaint of  Chief Complaint  Patient presents with   Dysuria    Recent Results (from the past 720 hour(s))  Urine Culture     Status: Abnormal   Collection Time: 06/12/23 10:39 AM   Specimen: Urine, Catheterized  Result Value Ref Range Status   Specimen Description   Final    URINE, CATHETERIZED Performed at [facility], [address] [place], [place] [address]    Special Requests   Final    Normal Performed at [facility], [address] [place], [place] [address]    Culture (A)  Final    10,000 COLONIES/mL KLEBSIELLA PNEUMONIAE 30,000 COLONIES/mL ESCHERICHIA COLI Confirmed Extended Spectrum Beta-Lactamase Producer (ESBL).  In bloodstream infections from ESBL organisms, carbapenems are preferred over piperacillin/tazobactam. They are shown to have a lower risk of mortality.    Report Status 06/14/2023 FINAL  Final   Organism ID, Bacteria KLEBSIELLA PNEUMONIAE (A)  Final   Organism ID, Bacteria ESCHERICHIA COLI (A)  Final      Susceptibility   Escherichia coli - MIC*    AMPICILLIN >=32 RESISTANT Resistant     CEFAZOLIN >=64 RESISTANT Resistant     CEFEPIME 16 RESISTANT Resistant     CEFTRIAXONE >=64 RESISTANT Resistant     CIPROFLOXACIN >=4 RESISTANT Resistant     GENTAMICIN <=1 SENSITIVE Sensitive     IMIPENEM 1 SENSITIVE Sensitive     NITROFURANTOIN <=16 SENSITIVE Sensitive     TRIMETH/SULFA >=320 RESISTANT Resistant     AMPICILLIN/SULBACTAM 16 INTERMEDIATE Intermediate     PIP/TAZO <=4 SENSITIVE Sensitive ug/mL    * 30,000 COLONIES/mL ESCHERICHIA COLI   Klebsiella pneumoniae - MIC*    AMPICILLIN >=32 RESISTANT Resistant     CEFAZOLIN >=64 RESISTANT Resistant     CEFEPIME 16 RESISTANT Resistant     CEFTRIAXONE >=64 RESISTANT Resistant     CIPROFLOXACIN >=4 RESISTANT  Resistant     GENTAMICIN <=1 SENSITIVE Sensitive     IMIPENEM <=0.25 SENSITIVE Sensitive     NITROFURANTOIN 64 INTERMEDIATE Intermediate     TRIMETH/SULFA <=20 SENSITIVE Sensitive     AMPICILLIN/SULBACTAM 16 INTERMEDIATE Intermediate     PIP/TAZO <=4 SENSITIVE Sensitive ug/mL    * 10,000 COLONIES/mL KLEBSIELLA PNEUMONIAE    [x]  Patient discharged originally without antimicrobial agent and treatment is not indicated   ED Provider: Glendora Score, MD  Lennie Muckle, PharmD PGY1 Pharmacy Resident 06/15/2023 8:12 AM

## 2023-09-02 ENCOUNTER — Other Ambulatory Visit: Payer: Self-pay

## 2023-09-02 ENCOUNTER — Encounter (HOSPITAL_BASED_OUTPATIENT_CLINIC_OR_DEPARTMENT_OTHER): Payer: Self-pay | Admitting: Emergency Medicine

## 2023-09-02 ENCOUNTER — Emergency Department (HOSPITAL_BASED_OUTPATIENT_CLINIC_OR_DEPARTMENT_OTHER)
Admission: EM | Admit: 2023-09-02 | Discharge: 2023-09-03 | Disposition: A | Discharge status: Home / Self Care | Payer: Medicare Other | Attending: Emergency Medicine | Admitting: Emergency Medicine

## 2023-09-02 DIAGNOSIS — M545 Low back pain, unspecified: Secondary | ICD-10-CM | POA: Insufficient documentation

## 2023-09-02 DIAGNOSIS — Z978 Presence of other specified devices: Secondary | ICD-10-CM

## 2023-09-02 DIAGNOSIS — R319 Hematuria, unspecified: Secondary | ICD-10-CM | POA: Insufficient documentation

## 2023-09-02 DIAGNOSIS — R8289 Other abnormal findings on cytological and histological examination of urine: Secondary | ICD-10-CM | POA: Diagnosis not present

## 2023-09-02 DIAGNOSIS — I10 Essential (primary) hypertension: Secondary | ICD-10-CM | POA: Diagnosis not present

## 2023-09-02 DIAGNOSIS — N3001 Acute cystitis with hematuria: Secondary | ICD-10-CM

## 2023-09-02 HISTORY — DX: Presence of other specified devices: Z97.8

## 2023-09-02 NOTE — ED Triage Notes (Signed)
 Patient reports "bleeding around penis" and hematuria since accidentally "pulling on catheter" last week. Reports he was evaluated at [facility] last week for same, however bleeding resumed today. Denies new injuries

## 2023-09-03 ENCOUNTER — Emergency Department (HOSPITAL_BASED_OUTPATIENT_CLINIC_OR_DEPARTMENT_OTHER): Payer: Medicare Other

## 2023-09-03 LAB — URINALYSIS, ROUTINE W REFLEX MICROSCOPIC
Bilirubin Urine: NEGATIVE
Glucose, UA: NEGATIVE mg/dL
Ketones, ur: NEGATIVE mg/dL
Nitrite: NEGATIVE
Protein, ur: 100 mg/dL — AB
RBC / HPF: >50 RBC/hpf (ref 0–5)
Specific Gravity, Urine: 1.007 (ref 1.005–1.030)
WBC, UA: >50 WBC/hpf (ref 0–5)
pH: 6.5 (ref 5.0–8.0)

## 2023-09-03 MED ORDER — CEPHALEXIN 250 MG PO CAPS
500.0000 mg | ORAL_CAPSULE | Freq: Once | ORAL | Status: AC
  Administered 2023-09-03: 500 mg via ORAL
  Filled 2023-09-03: qty 2

## 2023-09-03 MED ORDER — CEPHALEXIN 500 MG PO CAPS: 500.0000 mg | ORAL_CAPSULE | Freq: Three times a day (TID) | ORAL | 0 refills | Status: AC

## 2023-09-03 MED ORDER — CEPHALEXIN 500 MG PO CAPS: 500.0000 mg | ORAL_CAPSULE | Freq: Three times a day (TID) | ORAL | 0 refills | Status: DC

## 2023-09-03 NOTE — Discharge Instructions (Signed)
 Begin taking Keflex  as prescribed.  Follow-up with urology.  A referral has been placed to [facility] for you.  Call the attached number if you have not heard from them in the next 2 days to make these arrangements.

## 2023-09-03 NOTE — ED Provider Notes (Signed)
 [place] EMERGENCY DEPARTMENT AT [facility] Provider Note   CSN: [id number] Arrival date & time: 09/02/23  2312     History  Chief Complaint  Patient presents with   bleeding catheter   Hematuria    Caleb Hines is a 68 y.o. male.  This patient is a 68 year old male with past medical history of BPH, hypertension, hyperlipidemia.  Patient has indwelling Foley catheter.  Patient presenting today with complaints of low back pain and blood in the urine bag.  He does report walking and inadvertently pulling on the catheter last week.  He did notice some blood in the urine bag and was seen at an outside facility.  This seemed to clear, then has recurred this evening.  He describes grossly bloody urine in the bag and also is experiencing urine flowing around the catheter from his penis.  He denies any fevers or chills.  The history is provided by the patient.       Home Medications Prior to Admission medications   Medication Sig Start Date End Date Taking? Authorizing Provider  Ascorbic Acid (VITAMIN C PO) Take 2 tablets by mouth daily.    [provider]  atenolol (TENORMIN) 50 MG tablet Take 50 mg by mouth daily.      [provider]  atorvastatin (LIPITOR) 10 MG tablet Take 10 mg by mouth daily.    [provider]  benzonatate  (TESSALON ) 100 MG capsule Take 1 capsule (100 mg total) by mouth every 8 (eight) hours. 10/28/15   Ballard Bongo, MD  fluticasone  (FLONASE ) 50 MCG/ACT nasal spray Place 1 spray into both nostrils daily. 10/28/15   Ballard Bongo, MD  HYDRALAZINE-HCTZ PO Take by mouth.    [provider]  hydrochlorothiazide (HYDRODIURIL) 25 MG tablet Take 25 mg by mouth daily.    [provider]  MAGNESIUM OXIDE PO Take 1 tablet by mouth daily.    [provider]  METOPROLOL SUCCINATE ER PO Take by mouth.    [provider]  Multiple Vitamin (MULITIVITAMIN WITH MINERALS) TABS Take 1 tablet  by mouth daily.    [provider]  Omega-3 Fatty Acids (FISH OIL PO) Take 1 capsule by mouth daily.    [provider]  polyethylene glycol (MIRALAX ) 17 g packet Take 17 g by mouth daily. 06/02/21   Scarlette Currier, MD  tamsulosin  (FLOMAX ) 0.4 MG CAPS capsule Take 1 capsule (0.4 mg total) by mouth daily. 06/02/21   Scarlette Currier, MD  telmisartan-hydrochlorothiazide (MICARDIS HCT) 40-12.5 MG tablet Take 1 tablet by mouth 2 (two) times daily.    [provider]      Allergies    Amlodipine, Amoxicillin -pot clavulanate, Dairycare [bacid], Milk-related compounds, Soy allergy (obsolete), and Wheat    Review of Systems   Review of Systems  All other systems reviewed and are negative.   Physical Exam Updated Vital Signs BP (!) 174/93   Pulse 68   Temp 97.8 F (36.6 C) (Temporal)   Resp 20   Ht 6\' 2"  (1.88 m)   Wt 93 kg   SpO2 99%   BMI 26.32 kg/m  Physical Exam Vitals and nursing note reviewed.  Constitutional:      General: He is not in acute distress.    Appearance: He is well-developed. He is not diaphoretic.  HENT:     Head: Normocephalic and atraumatic.  Cardiovascular:     Rate and Rhythm: Normal rate and regular rhythm.     Heart sounds: No  murmur heard.    No friction rub.  Pulmonary:     Effort: Pulmonary effort is normal. No respiratory distress.     Breath sounds: Normal breath sounds. No wheezing or rales.  Abdominal:     General: Bowel sounds are normal. There is no distension.     Palpations: Abdomen is soft.     Tenderness: There is no abdominal tenderness.     Comments: There is grossly bloody urine present in the catheter bag.  Musculoskeletal:        General: Normal range of motion.     Cervical back: Normal range of motion and neck supple.  Skin:    General: Skin is warm and dry.  Neurological:     Mental Status: He is alert and oriented to person, place, and time.     Coordination: Coordination normal.     ED  Results / Procedures / Treatments   Labs (all labs ordered are listed, but only abnormal results are displayed) Labs Reviewed  URINALYSIS, ROUTINE W REFLEX MICROSCOPIC    EKG None  Radiology No results found.  Procedures Procedures    Medications Ordered in ED Medications - No data to display  ED Course/ Medical Decision Making/ A&P  Patient is a 68 year old male presenting with complaints of hematuria.  He has an indwelling Foley catheter for quite some time and noticed blood in the bag and leaking around the catheter itself since earlier today.  Patient arrives with stable vital signs and is afebrile.  He is clinically well-appearing and abdomen is benign.  Urinalysis obtained which is grossly bloody, but does show greater than 50 white cells, moderate leukocytes, and many bacteria concerning for UTI.  CT with renal protocol obtained showing mild inflammation of the urinary bladder and possibly blood in the bladder, but no other acute findings.  Patient to be treated for UTI with Keflex .  He has seen an outside urologist and is requesting referral locally.  Patient will be referred to [facility] urology.  Final Clinical Impression(s) / ED Diagnoses Final diagnoses:  None    Rx / DC Orders ED Discharge Orders     None         Orvilla Blander, MD 09/03/23 [phone]

## 2023-09-04 ENCOUNTER — Emergency Department (HOSPITAL_BASED_OUTPATIENT_CLINIC_OR_DEPARTMENT_OTHER)
Admission: EM | Admit: 2023-09-04 | Discharge: 2023-09-04 | Disposition: A | Discharge status: Home / Self Care | Payer: Medicare Other | Attending: Emergency Medicine | Admitting: Emergency Medicine

## 2023-09-04 ENCOUNTER — Other Ambulatory Visit: Payer: Self-pay

## 2023-09-04 DIAGNOSIS — Z79899 Other long term (current) drug therapy: Secondary | ICD-10-CM | POA: Insufficient documentation

## 2023-09-04 DIAGNOSIS — Z87442 Personal history of urinary calculi: Secondary | ICD-10-CM | POA: Insufficient documentation

## 2023-09-04 DIAGNOSIS — Z960 Presence of urogenital implants: Secondary | ICD-10-CM | POA: Insufficient documentation

## 2023-09-04 DIAGNOSIS — I10 Essential (primary) hypertension: Secondary | ICD-10-CM | POA: Diagnosis not present

## 2023-09-04 DIAGNOSIS — R310 Gross hematuria: Secondary | ICD-10-CM

## 2023-09-04 DIAGNOSIS — R319 Hematuria, unspecified: Secondary | ICD-10-CM | POA: Diagnosis present

## 2023-09-04 DIAGNOSIS — Z978 Presence of other specified devices: Secondary | ICD-10-CM

## 2023-09-04 LAB — CBC WITH DIFFERENTIAL/PLATELET
Abs Immature Granulocytes: 0.02 10*3/uL (ref 0.00–0.07)
Basophils Absolute: 0 10*3/uL (ref 0.0–0.1)
Basophils Relative: 1 %
Eosinophils Absolute: 0.2 10*3/uL (ref 0.0–0.5)
Eosinophils Relative: 4 %
HCT: 43.8 % (ref 39.0–52.0)
Hemoglobin: 14.4 g/dL (ref 13.0–17.0)
Immature Granulocytes: 0 %
Lymphocytes Relative: 29 %
Lymphs Abs: 1.7 10*3/uL (ref 0.7–4.0)
MCH: 29.7 pg (ref 26.0–34.0)
MCHC: 32.9 g/dL (ref 30.0–36.0)
MCV: 90.3 fL (ref 80.0–100.0)
Monocytes Absolute: 0.5 10*3/uL (ref 0.1–1.0)
Monocytes Relative: 10 %
Neutro Abs: 3.2 10*3/uL (ref 1.7–7.7)
Neutrophils Relative %: 56 %
Platelets: 268 10*3/uL (ref 150–400)
RBC: 4.85 MIL/uL (ref 4.22–5.81)
RDW: 13.3 % (ref 11.5–15.5)
WBC: 5.7 10*3/uL (ref 4.0–10.5)
nRBC: 0 % (ref 0.0–0.2)

## 2023-09-04 LAB — COMPREHENSIVE METABOLIC PANEL
ALT: 26 U/L (ref 0–44)
AST: 19 U/L (ref 15–41)
Albumin: 4.1 g/dL (ref 3.5–5.0)
Alkaline Phosphatase: 57 U/L (ref 38–126)
Anion gap: 5 (ref 5–15)
BUN: 14 mg/dL (ref 8–23)
CO2: 31 mmol/L (ref 22–32)
Calcium: 9.9 mg/dL (ref 8.9–10.3)
Chloride: 99 mmol/L (ref 98–111)
Creatinine, Ser: 0.94 mg/dL (ref 0.61–1.24)
GFR, Estimated: >60 mL/min (ref >60)
Glucose, Bld: 103 mg/dL — ABNORMAL HIGH (ref 70–99)
Potassium: 3.9 mmol/L (ref 3.5–5.1)
Sodium: 135 mmol/L (ref 135–145)
Total Bilirubin: 0.5 mg/dL (ref 0.0–1.2)
Total Protein: 7.9 g/dL (ref 6.5–8.1)

## 2023-09-04 LAB — URINALYSIS, MICROSCOPIC (REFLEX): RBC / HPF: >50 RBC/hpf (ref 0–5)

## 2023-09-04 LAB — URINALYSIS, ROUTINE W REFLEX MICROSCOPIC

## 2023-09-04 MED ORDER — TAMSULOSIN HCL 0.4 MG PO CAPS: 0.4000 mg | ORAL_CAPSULE | Freq: Every day | ORAL | 0 refills | Status: AC

## 2023-09-04 NOTE — ED Provider Notes (Signed)
[place] EMERGENCY DEPARTMENT AT [facility] Provider Note  CSN: [id number] Arrival date & time: 09/04/23 4098  Chief Complaint(s) Hematuria  HPI Caleb Hines is a 68 y.o. male with past medical history as below, significant for hypertension, chronic indwelling catheter who presents to the ED with complaint of hematuria  He has care primarily with [facility] but has been transitioning here to be more local.  chronic indwelling catheter for the past few years.  He noticed hematuria over the past 48 hours, has gradually worsened.  He has noticed some small clots in his Foley bag.  Feels like his bladder is draining appropriately, no abdominal pain nausea or vomiting.  He is feeling well otherwise, he was seen here on 2/9, had CT imaging, start antibiotics, referred to urology.  Past Medical History Past Medical History:  Diagnosis Date   Hypertension    Indwelling Foley catheter present    since 2021   Kidney stones    Sinusitis    There are no active problems to display for this patient.  Home Medication(s) Prior to Admission medications   Medication Sig Start Date End Date Taking? Authorizing Provider  Ascorbic Acid (VITAMIN C PO) Take 2 tablets by mouth daily.    [provider]  atenolol (TENORMIN) 50 MG tablet Take 50 mg by mouth daily.      [provider]  atorvastatin (LIPITOR) 10 MG tablet Take 10 mg by mouth daily.    [provider]  benzonatate (TESSALON) 100 MG capsule Take 1 capsule (100 mg total) by mouth every 8 (eight) hours. 10/28/15   Gilda Crease, MD  cephALEXin (KEFLEX) 500 MG capsule Take 1 capsule (500 mg total) by mouth 3 (three) times daily. 09/03/23   Geoffery Lyons, MD  fluticasone (FLONASE) 50 MCG/ACT nasal spray Place 1 spray into both nostrils daily. 10/28/15   Gilda Crease, MD  HYDRALAZINE-HCTZ PO Take by mouth.    [provider]  hydrochlorothiazide (HYDRODIURIL) 25 MG tablet Take 25 mg by  mouth daily.    [provider]  MAGNESIUM OXIDE PO Take 1 tablet by mouth daily.    [provider]  METOPROLOL SUCCINATE ER PO Take by mouth.    [provider]  Multiple Vitamin (MULITIVITAMIN WITH MINERALS) TABS Take 1 tablet by mouth daily.    [provider]  Omega-3 Fatty Acids (FISH OIL PO) Take 1 capsule by mouth daily.    [provider]  polyethylene glycol (MIRALAX) 17 g packet Take 17 g by mouth daily. 06/02/21   Alvira Monday, MD  tamsulosin (FLOMAX) 0.4 MG CAPS capsule Take 1 capsule (0.4 mg total) by mouth daily. 06/02/21   Alvira Monday, MD  telmisartan-hydrochlorothiazide (MICARDIS HCT) 40-12.5 MG tablet Take 1 tablet by mouth 2 (two) times daily.    [provider]  Past Surgical History No past surgical history on file. Family History No family history on file.  Social History Social History   Tobacco Use   Smoking status: Never   Smokeless tobacco: Never  Substance Use Topics   Alcohol use: No   Drug use: No   Allergies Amlodipine, Amoxicillin-pot clavulanate, Dairycare [bacid], Milk-related compounds, Soy allergy (obsolete), and Wheat  Review of Systems A thorough review of systems was obtained and all systems are negative except as noted in the HPI and PMH.   Physical Exam Vital Signs  I have reviewed the triage vital signs BP (!) 152/84   Pulse 60   Temp 98.3 F (36.8 C)   Resp 16   Ht 6\' 2"  (1.88 m)   Wt 93 kg   SpO2 100%   BMI 26.32 kg/m  Physical Exam Vitals and nursing note reviewed.  Constitutional:      General: He is not in acute distress.    Appearance: Normal appearance. He is well-developed. He is not ill-appearing.  HENT:     Head: Normocephalic and atraumatic.     Right Ear: External ear normal.     Left Ear: External ear normal.     Nose:  Nose normal.     Mouth/Throat:     Mouth: Mucous membranes are moist.  Eyes:     General: No scleral icterus.       Right eye: No discharge.        Left eye: No discharge.  Cardiovascular:     Rate and Rhythm: Normal rate.  Pulmonary:     Effort: Pulmonary effort is normal. No respiratory distress.     Breath sounds: No stridor.  Abdominal:     General: Abdomen is flat. There is no distension.     Palpations: Abdomen is soft.     Tenderness: There is no abdominal tenderness. There is no guarding.     Comments: Foley catheter, hematuria noted in catheter bag  Musculoskeletal:        General: No deformity.     Cervical back: No rigidity.  Skin:    General: Skin is warm and dry.     Coloration: Skin is not cyanotic, jaundiced or pale.  Neurological:     Mental Status: He is alert and oriented to person, place, and time.     GCS: GCS eye subscore is 4. GCS verbal subscore is 5. GCS motor subscore is 6.  Psychiatric:        Speech: Speech normal.        Behavior: Behavior normal. Behavior is cooperative.     ED Results and Treatments Labs (all labs ordered are listed, but only abnormal results are displayed) Labs Reviewed  URINALYSIS, ROUTINE W REFLEX MICROSCOPIC - Abnormal; Notable for the following components:      Result Value   Color, Urine RED (*)    APPearance TURBID (*)    Glucose, UA   (*)    Value: TEST NOT REPORTED DUE TO COLOR INTERFERENCE OF URINE PIGMENT   Hgb urine dipstick   (*)    Value: TEST NOT REPORTED DUE TO COLOR INTERFERENCE OF URINE PIGMENT   Bilirubin Urine   (*)    Value: TEST NOT REPORTED DUE TO COLOR INTERFERENCE OF URINE PIGMENT   Ketones, ur   (*)    Value: TEST NOT REPORTED DUE TO COLOR INTERFERENCE OF URINE PIGMENT   Protein, ur   (*)    Value: TEST NOT REPORTED DUE TO  COLOR INTERFERENCE OF URINE PIGMENT   Nitrite   (*)    Value: TEST NOT REPORTED DUE TO COLOR INTERFERENCE OF URINE PIGMENT   Leukocytes,Ua   (*)    Value: TEST NOT REPORTED  DUE TO COLOR INTERFERENCE OF URINE PIGMENT   All other components within normal limits  URINALYSIS, MICROSCOPIC (REFLEX) - Abnormal; Notable for the following components:   Bacteria, UA MANY (*)    All other components within normal limits  COMPREHENSIVE METABOLIC PANEL - Abnormal; Notable for the following components:   Glucose, Bld 103 (*)    All other components within normal limits  CBC WITH DIFFERENTIAL/PLATELET                                                                                                                          Radiology No results found.  Pertinent labs & imaging results that were available during my care of the patient were reviewed by me and considered in my medical decision making (see MDM for details).  Medications Ordered in ED Medications - No data to display                                                                                                                                   Procedures Procedures  (including critical care time)  Medical Decision Making / ED Course    Medical Decision Making:    Daud Cayer is a 68 y.o. male with past medical history as below, significant for hypertension, chronic indwelling catheter who presents to the ED with complaint of hematuria. The complaint involves an extensive differential diagnosis and also carries with it a high risk of complications and morbidity.  Serious etiology was considered. Ddx includes but is not limited to: Hemorrhagic cystitis, acute cystitis, AVM, bladder trauma, etc.  Complete initial physical exam performed, notably the patient was in no distress, abdomen soft, nontender.    Reviewed and confirmed nursing documentation for past medical history, family history, social history.  Vital signs reviewed.      Clinical Course as of 09/04/23 1515  Tue Sep 04, 2023  1346 Hemoglobin: 14.4 Stable hgb [SG]  1346 Creatinine: 0.94 Stable cr [SG]  1349 Bladder scan w/ 58 mL's, no  retention [SG]  1358 Paged urology, awaiting callback  [SG]  1428 Repaged urology, still no callback [SG]    Clinical Course User Index [SG] Sloan Leiter,  DO    Brief summary: 67-year male with history of indwelling Foley catheter, history otherwise as above here with hematuria.  Ongoing the past 2 days.  Seen here 2/9 had reassuring CT, start antibiotics for cystitis.  He is here today for worsening bleeding.  No pain.  He is not on blood thinners.  He has not had follow-up with urology since recent visit.  Hgb stable, Cr stable.  He is not retaining.  He is voiding freely with Foley.  Discussed with urology Dr. Marlou Porch who will arrange for outpatient follow-up.  He has had the Foley catheter for approximately 2 months, will exchange in the ER.  The patient improved significantly and was discharged in stable condition. Detailed discussions were had with the patient/guardian regarding current findings, and need for close f/u with PCP or on call doctor. The patient/guardian has been instructed to return immediately if the symptoms worsen in any way for re-evaluation. Patient/guardian verbalized understanding and is in agreement with current care plan. All questions answered prior to discharge.                  Additional history obtained: -Additional history obtained from na -External records from outside source obtained and reviewed including: Chart review including previous notes, labs, imaging, consultation notes including  Recent ER visits, prior labs and imaging, home meds   Lab Tests: -I ordered, reviewed, and interpreted labs.   The pertinent results include:   Labs Reviewed  URINALYSIS, ROUTINE W REFLEX MICROSCOPIC - Abnormal; Notable for the following components:      Result Value   Color, Urine RED (*)    APPearance TURBID (*)    Glucose, UA   (*)    Value: TEST NOT REPORTED DUE TO COLOR INTERFERENCE OF URINE PIGMENT   Hgb urine dipstick   (*)    Value: TEST  NOT REPORTED DUE TO COLOR INTERFERENCE OF URINE PIGMENT   Bilirubin Urine   (*)    Value: TEST NOT REPORTED DUE TO COLOR INTERFERENCE OF URINE PIGMENT   Ketones, ur   (*)    Value: TEST NOT REPORTED DUE TO COLOR INTERFERENCE OF URINE PIGMENT   Protein, ur   (*)    Value: TEST NOT REPORTED DUE TO COLOR INTERFERENCE OF URINE PIGMENT   Nitrite   (*)    Value: TEST NOT REPORTED DUE TO COLOR INTERFERENCE OF URINE PIGMENT   Leukocytes,Ua   (*)    Value: TEST NOT REPORTED DUE TO COLOR INTERFERENCE OF URINE PIGMENT   All other components within normal limits  URINALYSIS, MICROSCOPIC (REFLEX) - Abnormal; Notable for the following components:   Bacteria, UA MANY (*)    All other components within normal limits  COMPREHENSIVE METABOLIC PANEL - Abnormal; Notable for the following components:   Glucose, Bld 103 (*)    All other components within normal limits  CBC WITH DIFFERENTIAL/PLATELET    Notable for stable labs, ongoing UTI  EKG   EKG Interpretation Date/Time:    Ventricular Rate:    PR Interval:    QRS Duration:    QT Interval:    QTC Calculation:   R Axis:      Text Interpretation:           Imaging Studies ordered: na   Medicines ordered and prescription drug management: No orders of the defined types were placed in this encounter.   -I have reviewed the patients home medicines and have made adjustments as needed   Consultations Obtained:  I requested consultation with the urology,  and discussed lab and imaging findings as well as pertinent plan - they recommend: see in office   Cardiac Monitoring: Continuous pulse oximetry interpreted by myself, 100% on RA.    Social Determinants of Health:  Diagnosis or treatment significantly limited by social determinants of health: na   Reevaluation: After the interventions noted above, I reevaluated the patient and found that they have improved  Co morbidities that complicate the patient evaluation  Past Medical  History:  Diagnosis Date   Hypertension    Indwelling Foley catheter present    since 2021   Kidney stones    Sinusitis       Dispostion: Disposition decision including need for hospitalization was considered, and patient discharged from emergency department.    Final Clinical Impression(s) / ED Diagnoses Final diagnoses:  Gross hematuria  Indwelling Foley catheter present        Sloan Leiter, DO 09/04/23 1515

## 2023-09-04 NOTE — ED Notes (Signed)
Pt given discharge instructions and reviewed prescriptions. Opportunities given for questions. Pt verbalizes understanding. Jillyn Hidden, RN

## 2023-09-04 NOTE — ED Triage Notes (Signed)
Reports hematuria x 3 days. Has foley in place since December.

## 2023-09-04 NOTE — Discharge Instructions (Signed)
It was a pleasure caring for you today in the emergency department.  Please follow up with urology for trial of void and ?cystoscopy; further evaluation of the hematuria/blood in urine  Please return to the emergency department for any worsening or worrisome symptoms.

## 2023-09-04 NOTE — ED Notes (Signed)
Spoke with MD regarding taking home medications. MD aware and okay with patient taking them.
